# Patient Record
Sex: Female | Born: 1980 | Race: White | Hispanic: Refuse to answer | Marital: Married | Smoking: Never smoker
Health system: Southern US, Community
[De-identification: ages and names within clinical notes are randomized; demographics above are authoritative.]

## PROBLEM LIST (undated history)

## (undated) DIAGNOSIS — J309 Allergic rhinitis, unspecified: Secondary | ICD-10-CM

## (undated) DIAGNOSIS — K219 Gastro-esophageal reflux disease without esophagitis: Secondary | ICD-10-CM

## (undated) DIAGNOSIS — N200 Calculus of kidney: Secondary | ICD-10-CM

## (undated) DIAGNOSIS — D693 Immune thrombocytopenic purpura: Secondary | ICD-10-CM

## (undated) DIAGNOSIS — M5432 Sciatica, left side: Secondary | ICD-10-CM

## (undated) DIAGNOSIS — IMO0002 Reserved for concepts with insufficient information to code with codable children: Secondary | ICD-10-CM

## (undated) DIAGNOSIS — Z800 Family history of malignant neoplasm of digestive organs: Secondary | ICD-10-CM

## (undated) DIAGNOSIS — O99113 Other diseases of the blood and blood-forming organs and certain disorders involving the immune mechanism complicating pregnancy, third trimester: Principal | ICD-10-CM

## (undated) DIAGNOSIS — R51 Headache: Secondary | ICD-10-CM

## (undated) DIAGNOSIS — F419 Anxiety disorder, unspecified: Secondary | ICD-10-CM

## (undated) DIAGNOSIS — Z8042 Family history of malignant neoplasm of prostate: Secondary | ICD-10-CM

## (undated) HISTORY — PX: WISDOM TOOTH EXTRACTION: SHX21

## (undated) HISTORY — DX: Sciatica, left side: M54.32

## (undated) HISTORY — DX: Family history of malignant neoplasm of digestive organs: Z80.0

## (undated) HISTORY — DX: Gastro-esophageal reflux disease without esophagitis: K21.9

## (undated) HISTORY — DX: Immune thrombocytopenic purpura: D69.3

## (undated) HISTORY — DX: Family history of malignant neoplasm of prostate: Z80.42

## (undated) HISTORY — PX: OTHER SURGICAL HISTORY: SHX169

## (undated) HISTORY — DX: Calculus of kidney: N20.0

## (undated) HISTORY — DX: Allergic rhinitis, unspecified: J30.9

## (undated) HISTORY — DX: Reserved for concepts with insufficient information to code with codable children: IMO0002

## (undated) HISTORY — DX: Other diseases of the blood and blood-forming organs and certain disorders involving the immune mechanism complicating pregnancy, third trimester: O99.113

---
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
## 2010-07-25 ENCOUNTER — Ambulatory Visit (INDEPENDENT_AMBULATORY_CARE_PROVIDER_SITE_OTHER): Payer: BC Managed Care – PPO | Admitting: Family Medicine

## 2010-07-25 ENCOUNTER — Encounter: Payer: Self-pay | Admitting: Family Medicine

## 2010-07-25 DIAGNOSIS — Z0000 Encounter for general adult medical examination without abnormal findings: Secondary | ICD-10-CM | POA: Insufficient documentation

## 2010-07-25 DIAGNOSIS — M799 Soft tissue disorder, unspecified: Secondary | ICD-10-CM

## 2010-07-25 DIAGNOSIS — M7989 Other specified soft tissue disorders: Secondary | ICD-10-CM

## 2010-07-25 LAB — HEPATIC FUNCTION PANEL
ALT: 16 U/L (ref 0–35)
AST: 20 U/L (ref 0–37)
Albumin: 4 g/dL (ref 3.5–5.2)
Alkaline Phosphatase: 48 U/L (ref 39–117)

## 2010-07-25 LAB — CBC WITH DIFFERENTIAL/PLATELET
Basophils Relative: 0.6 % (ref 0.0–3.0)
Eosinophils Relative: 7 % — ABNORMAL HIGH (ref 0.0–5.0)
Lymphocytes Relative: 26.6 % (ref 12.0–46.0)
MCV: 90.1 fl (ref 78.0–100.0)
Monocytes Relative: 7.3 % (ref 3.0–12.0)
Neutrophils Relative %: 58.5 % (ref 43.0–77.0)
RBC: 3.98 Mil/uL (ref 3.87–5.11)
WBC: 4.9 10*3/uL (ref 4.5–10.5)

## 2010-07-25 LAB — BASIC METABOLIC PANEL
BUN: 14 mg/dL (ref 6–23)
Chloride: 108 mEq/L (ref 96–112)
Glucose, Bld: 79 mg/dL (ref 70–99)
Potassium: 4 mEq/L (ref 3.5–5.1)

## 2010-07-25 LAB — TSH: TSH: 2.42 u[IU]/mL (ref 0.35–5.50)

## 2010-07-25 LAB — LIPID PANEL
Total CHOL/HDL Ratio: 5
VLDL: 25.4 mg/dL (ref 0.0–40.0)

## 2010-07-25 NOTE — Progress Notes (Signed)
  Subjective:    Patient ID: Rachael Chandler, female    DOB: April 26, 1980, 30 y.o.   MRN: [id number]  HPI Here today to establish care.  Previous MD- Isabell Jarvis [facility]).  GYN- [facility]  Swollen area on R buttock- fell in early July and area remains swollen.  No pain, bruise has resolved.   Review of Systems Patient reports no vision/hearing changes, adenopathy, fever, weight change, persistant/recurrent hoarseness, swallowing issues, chest pain, palpitations, edema, persistant/recurrent cough, hemoptysis, dyspnea (rest/ exertional/paroxysmal nocturnal), gastrointestinal bleeding (melena, rectal bleeding), abdominal pain, significant heartburn, bowel changes, GU symptoms (dysuria, hematuria, pyuria, incontinence), Gyn symptoms (abnormal  bleeding , pain), syncope, focal weakness, memory loss, numbness & tingling, skin/hair/nail changes, abnormal bruising or bleeding, anxiety,or depression.    Objective:   Physical Exam BP 112/76  Temp(Src) 97 F (36.1 C) (Oral)  Ht 5\' 3"  (1.6 m)  Wt 159 lb 6 oz (72.292 kg)  BMI 28.23 kg/m2  General Appearance:    Alert, cooperative, no distress, appears stated age  Head:    Normocephalic, without obvious abnormality, atraumatic  Eyes:    PERRL, conjunctiva/corneas clear, EOM's intact, fundi    benign, both eyes  Ears:    Normal TM's and external ear canals, both ears  Nose:   Nares normal, septum midline, mucosa normal, no drainage    or sinus tenderness  Throat:   Lips, mucosa, and tongue normal; teeth and gums normal  Neck:   Supple, symmetrical, trachea midline, no adenopathy;    thyroid:  no enlargement/tenderness/nodules  Back:     Symmetric, no curvature, ROM normal, no CVA tenderness  Lungs:     Clear to auscultation bilaterally, respirations unlabored  Chest Wall:    No tenderness or deformity   Heart:    Regular rate and rhythm, S1 and S2 normal, no murmur, rub   or gallop  Breast Exam:    GYN  Abdomen:     Soft, non-tender, bowel  sounds active all four quadrants,    no masses, no organomegaly  Genitalia:    GYN  Rectal:    GYN  Extremities:   Extremities normal, atraumatic, no cyanosis or edema.  R buttock w/ firm, soft tissue mass ~5 cm diameter- nontender.  Pulses:   2+ and symmetric all extremities  Skin:   Skin color, texture, turgor normal, no rashes or lesions  Lymph nodes:   Cervical, supraclavicular, and axillary nodes normal  Neurologic:   CNII-XII intact, normal strength, sensation and reflexes    throughout          Assessment & Plan:

## 2010-07-25 NOTE — Patient Instructions (Signed)
We will notify you of your lab results Someone will call you with your ultrasound appt Call with any questions or concerns Your exam looks great!  Keep up the good work! Welcome!  We're glad to have you!

## 2010-07-27 ENCOUNTER — Other Ambulatory Visit: Payer: Self-pay | Admitting: Family Medicine

## 2010-07-27 ENCOUNTER — Ambulatory Visit
Admission: RE | Admit: 2010-07-27 | Discharge: 2010-07-27 | Disposition: A | Discharge status: Home / Self Care | Payer: BC Managed Care – PPO | Source: Ambulatory Visit | Attending: Family Medicine | Admitting: Family Medicine

## 2010-07-27 DIAGNOSIS — M7989 Other specified soft tissue disorders: Secondary | ICD-10-CM

## 2010-07-28 LAB — VITAMIN D 1,25 DIHYDROXY
Vitamin D 1, 25 (OH)2 Total: 43 pg/mL (ref 18–72)
Vitamin D3 1, 25 (OH)2: 43 pg/mL

## 2010-07-30 ENCOUNTER — Encounter: Payer: Self-pay | Admitting: *Deleted

## 2010-07-30 ENCOUNTER — Telehealth: Payer: Self-pay | Admitting: *Deleted

## 2010-07-30 NOTE — Telephone Encounter (Addendum)
Left message to call office  Message copied by Candie Echevaria on Mon Jul 30, 2010  8:30 AM ------      Message from: Neena Rhymes      Created: Sun Jul 29, 2010  8:45 PM       No mass seen on [place]- radiologist suspects what we discussed during OV, post-traumatic fat necrosis.  This is where injury causes fat cells to rupture and then reshape.  At this time there is no need for further w/u but will continue to follow.

## 2010-07-30 NOTE — Telephone Encounter (Signed)
Pt aware.

## 2010-08-07 NOTE — Assessment & Plan Note (Signed)
Area on buttock is most likely fat necrosis- discussed this w/ pt.  Will get [place] to r/o other mass lesions.  Pt expressed understanding and is in agreement w/ plan.

## 2010-08-07 NOTE — Assessment & Plan Note (Signed)
Pt's PE WNL w/ exception of soft tissue mass (see below).  UTD on health maintenance, anticipatory guidance provided.

## 2011-01-22 ENCOUNTER — Telehealth: Payer: Self-pay

## 2011-01-22 NOTE — Telephone Encounter (Signed)
Agree w/ advice given.  If concerns re: clot needs ER or UC evaluation.  If having back and/or leg pain w/out swelling of leg, redness, pain to touch lower leg she can be seen in the office by any of [place].

## 2011-01-22 NOTE — Telephone Encounter (Signed)
Pt left message seeking advise on pain in back and leg. Pt questions blood clot in leg.  Returned call to pt. Left a message on personally identified voicemail advising pt that if she thinks it is a blood clot, she should be seen at an Urgent Care or ED due to the severity of clot situations. Message also advised pt to be seen in office if she doesn't think it is a clot but it can't wait until her scheduled appointment on Oct. 11, 2012. Pt advised that Dr. Beverely Low is out of the office this afternoon but that she can be seen by another physician.

## 2011-01-31 ENCOUNTER — Encounter: Payer: Self-pay | Admitting: Family Medicine

## 2011-01-31 ENCOUNTER — Ambulatory Visit (INDEPENDENT_AMBULATORY_CARE_PROVIDER_SITE_OTHER): Payer: BC Managed Care – PPO | Admitting: Family Medicine

## 2011-01-31 VITALS — BP 110/64 | Temp 98.6°F | Wt 156.0 lb

## 2011-01-31 DIAGNOSIS — M549 Dorsalgia, unspecified: Secondary | ICD-10-CM | POA: Insufficient documentation

## 2011-01-31 NOTE — Progress Notes (Signed)
  Subjective:    Patient ID: Rachael Chandler, female    DOB: 07-19-1980, 30 y.o.   MRN: [id number]  HPI Back pain- pt recently moved and was helping husband unpack the garage.  Bent forward and lifted heavy box w/out bending legs.  Pain radiated down L leg, had some numbness of toes.  Pain is improving.  This occurred 2 weeks ago and last felt numbness ~1 week ago.  No bowel or bladder incontinence.   Review of Systems For ROS see HPI     Objective:   Physical Exam  Vitals reviewed. Constitutional: She appears well-developed and well-nourished. No distress.  Musculoskeletal: Normal range of motion. She exhibits no edema and no tenderness.       (-) SLR bilaterally Full extension and flexion of back No TTP over spine or paraspinal muscles          Assessment & Plan:

## 2011-01-31 NOTE — Patient Instructions (Signed)
We'll call you with your physical therapy appt Take Ibuprofen as needed for pain or muscle spasm Heating pad for pain relief Lift w/ your legs!!! Call with any questions or concerns Hang in there!!!

## 2011-01-31 NOTE — Assessment & Plan Note (Signed)
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  She has very large breasts and has baseline level of back strain.  Pt would benefit from core strengthening to protect her spine and a home exercise program.  Will refer to PT.  Ibuprofen and heat prn.  Reviewed supportive care and red flags that should prompt return.  Pt expressed understanding and is in agreement w/ plan.

## 2011-03-25 ENCOUNTER — Telehealth: Payer: Self-pay | Admitting: *Deleted

## 2011-03-25 DIAGNOSIS — M549 Dorsalgia, unspecified: Secondary | ICD-10-CM

## 2011-03-25 NOTE — Telephone Encounter (Signed)
Discuss with patient, awaiting appt info. 

## 2011-03-25 NOTE — Telephone Encounter (Signed)
Pt called to report that she is still having issue with her back. Pt notes that when the back pain occurs she has several BM for that day. Pt indicated at OV Dr Beverely Low had asked about this issue but at the time she had not noticed any changes until recently. Pt has not been to physical therapy as of yet because she has not made a appt. .Please advise

## 2011-03-25 NOTE — Telephone Encounter (Signed)
She based on the fact that she has had back pain for almost 2 months- she needs ortho referral.  Should hold on PT until she is evaluated.

## 2011-04-02 ENCOUNTER — Encounter: Payer: Self-pay | Admitting: Family Medicine

## 2011-04-17 ENCOUNTER — Telehealth: Payer: Self-pay | Admitting: Family Medicine

## 2011-04-17 NOTE — Telephone Encounter (Signed)
IN REFERENCE TO ORTHOPAEDIC REFERRAL, SINCE 03-26-2011, PATIENT WILL NOT RETURN CALLS FROM [facility], MYSELF, OR RESPOND TO THE LETTER I MAILED.

## 2011-04-17 NOTE — Telephone Encounter (Signed)
Noted! Thank you

## 2011-05-23 ENCOUNTER — Ambulatory Visit (INDEPENDENT_AMBULATORY_CARE_PROVIDER_SITE_OTHER): Payer: BC Managed Care – PPO | Admitting: Family Medicine

## 2011-05-23 ENCOUNTER — Encounter: Payer: Self-pay | Admitting: Family Medicine

## 2011-05-23 VITALS — BP 115/75 | HR 107 | Temp 99.7°F | Ht 64.0 in | Wt 156.8 lb

## 2011-05-23 DIAGNOSIS — J111 Influenza due to unidentified influenza virus with other respiratory manifestations: Secondary | ICD-10-CM | POA: Insufficient documentation

## 2011-05-23 MED ORDER — GUAIFENESIN-CODEINE 100-10 MG/5ML PO SYRP: 10.0000 mL | ORAL_SOLUTION | Freq: Three times a day (TID) | ORAL | Status: AC | PRN

## 2011-05-23 MED ORDER — OSELTAMIVIR PHOSPHATE 75 MG PO CAPS: 75.0000 mg | ORAL_CAPSULE | Freq: Two times a day (BID) | ORAL | Status: AC

## 2011-05-23 NOTE — Patient Instructions (Signed)
This appears to be the Flu Start the Tamiflu today at lunch and take the 2nd dose before bed Use the cough syrup as needed Alternate tylenol and ibuprofen every 4 hrs for pain and fever relief REST! Call with any questions or concerns Hang in there!!!

## 2011-05-23 NOTE — Assessment & Plan Note (Signed)
Pt's sxs and PE consistent w/ flu.  Start Tamiflu.  Cough meds prn.  Reviewed supportive care and red flags that should prompt return.  Pt expressed understanding and is in agreement w/ plan.

## 2011-05-23 NOTE — Progress Notes (Signed)
  Subjective:    Patient ID: Rachael Chandler, female    DOB: December 12, 1980, 31 y.o.   MRN: [id number]  HPI Flu- + body aches, started yesterday morning.  sxs were sudden.  + low grade temps.  + cough- wet but not productive.  + HA.  + sinus pressure.  Bilateral ear fullness.  + sick contacts.  + flu exposure   Review of Systems For ROS see HPI     Objective:   Physical Exam  Vitals reviewed. Constitutional: She appears well-developed and well-nourished. No distress.       Obviously not feeling well  HENT:  Head: Normocephalic and atraumatic.  Right Ear: Tympanic membrane normal.  Left Ear: Tympanic membrane normal.  Nose: Mucosal edema and rhinorrhea present. Right sinus exhibits no maxillary sinus tenderness and no frontal sinus tenderness. Left sinus exhibits no maxillary sinus tenderness and no frontal sinus tenderness.  Mouth/Throat: Uvula is midline and mucous membranes are normal. Posterior oropharyngeal erythema present. No oropharyngeal exudate.  Eyes: Conjunctivae and EOM are normal. Pupils are equal, round, and reactive to light.  Neck: Normal range of motion. Neck supple.  Cardiovascular: Normal rate, regular rhythm and normal heart sounds.   Pulmonary/Chest: Effort normal and breath sounds normal. No respiratory distress. She has no wheezes.       + wet cough  Lymphadenopathy:    She has no cervical adenopathy.  Skin: Skin is warm.          Assessment & Plan:

## 2011-05-24 ENCOUNTER — Telehealth: Payer: Self-pay | Admitting: *Deleted

## 2011-05-24 NOTE — Telephone Encounter (Signed)
Yes, she can take OTC meds for symptom relief

## 2011-05-24 NOTE — Telephone Encounter (Signed)
Pt seen on 05-23-11 Dx with Flu. Pt would like to know if she can take OTC meds like day or night quil for her symptoms. Please advise

## 2011-05-24 NOTE — Telephone Encounter (Signed)
Discuss with patient  

## 2011-05-31 ENCOUNTER — Ambulatory Visit (INDEPENDENT_AMBULATORY_CARE_PROVIDER_SITE_OTHER): Payer: BC Managed Care – PPO | Admitting: Family Medicine

## 2011-05-31 ENCOUNTER — Encounter: Payer: Self-pay | Admitting: Family Medicine

## 2011-05-31 VITALS — BP 116/75 | HR 73 | Temp 98.4°F | Ht 63.75 in | Wt 152.6 lb

## 2011-05-31 DIAGNOSIS — J329 Chronic sinusitis, unspecified: Secondary | ICD-10-CM

## 2011-05-31 MED ORDER — AZITHROMYCIN 250 MG PO TABS: 250.0000 mg | ORAL_TABLET | Freq: Every day | ORAL | Status: AC

## 2011-05-31 NOTE — Assessment & Plan Note (Signed)
Pt's sxs and PE consistent w/ infxn.  Start Zpack at pt's request.  Reviewed supportive care and red flags that should prompt return.  Pt expressed understanding and is in agreement w/ plan.  

## 2011-05-31 NOTE — Patient Instructions (Signed)
This appears to be a sinus infection Start the Azithromycin as directed Restart Claritin or Zyrtec daily Add Sudafed for 3-5 days to dry up congestion Drink plenty of fluids Continue the mucinex Delsym or robitussin for cough REST! Hang in there!

## 2011-05-31 NOTE — Progress Notes (Signed)
  Subjective:    Patient ID: Rachael Chandler, female    DOB: 1980/08/01, 31 y.o.   MRN: [id number]  HPI ? Sinus infxn- reports 'tons of congestion' in head an chest.  Using mucinex w/out relief.  Denies facial pain.  Bilateral ear fullness.  + PND.  Cough- productive.  No fevers.  sxs started simultaneously w/ flu end of Jan but flu improved, this did not.   Review of Systems For ROS see HPI     Objective:   Physical Exam  Vitals reviewed. Constitutional: She appears well-developed and well-nourished. No distress.  HENT:  Head: Normocephalic and atraumatic.  Right Ear: Tympanic membrane normal.  Left Ear: Tympanic membrane normal.  Nose: Mucosal edema and rhinorrhea present. Right sinus exhibits maxillary sinus tenderness and frontal sinus tenderness. Left sinus exhibits maxillary sinus tenderness and frontal sinus tenderness.  Mouth/Throat: Uvula is midline and mucous membranes are normal. Posterior oropharyngeal erythema present. No oropharyngeal exudate.  Eyes: Conjunctivae and EOM are normal. Pupils are equal, round, and reactive to light.  Neck: Normal range of motion. Neck supple.  Cardiovascular: Normal rate, regular rhythm and normal heart sounds.   Pulmonary/Chest: Effort normal and breath sounds normal. No respiratory distress. She has no wheezes.       + hacking cough  Lymphadenopathy:    She has no cervical adenopathy.          Assessment & Plan:

## 2011-06-12 ENCOUNTER — Telehealth: Payer: Self-pay | Admitting: *Deleted

## 2011-06-12 NOTE — Telephone Encounter (Signed)
Left vm advising that pt needs to allow herself more time for sxs to subside per just recently on abt, however if the sxs do not subside by next week to please call for an OV.

## 2011-06-12 NOTE — Telephone Encounter (Signed)
Since her sinus infxn was just recently and she was on abx, I would give herself more time for her sxs to resolve.  If by next week her sxs continue, she will need OV.

## 2011-06-12 NOTE — Telephone Encounter (Signed)
Pt left vm stating that she is now having problems smelling and tasting things after her recent illness, noted on 05-23-11 pt treated for flu, on 05-31-11 pt treated for sinusitis, pt was not sure if she needed to come here for OV or be seen by another MD concerning this particular matter, please advise

## 2011-09-18 ENCOUNTER — Ambulatory Visit (INDEPENDENT_AMBULATORY_CARE_PROVIDER_SITE_OTHER): Payer: BC Managed Care – PPO | Admitting: Internal Medicine

## 2011-09-18 ENCOUNTER — Telehealth: Payer: Self-pay | Admitting: Family Medicine

## 2011-09-18 ENCOUNTER — Encounter: Payer: Self-pay | Admitting: Internal Medicine

## 2011-09-18 DIAGNOSIS — R195 Other fecal abnormalities: Secondary | ICD-10-CM

## 2011-09-18 DIAGNOSIS — R109 Unspecified abdominal pain: Secondary | ICD-10-CM

## 2011-09-18 LAB — POCT URINALYSIS DIPSTICK
Glucose, UA: NEGATIVE
Ketones, UA: NEGATIVE
Leukocytes, UA: NEGATIVE
Protein, UA: NEGATIVE
Spec Grav, UA: <=1.005
Urobilinogen, UA: 0.2

## 2011-09-18 LAB — POCT URINE PREGNANCY: Preg Test, Ur: NEGATIVE

## 2011-09-18 MED ORDER — HYOSCYAMINE SULFATE 0.125 MG SL SUBL: 0.1250 mg | SUBLINGUAL_TABLET | SUBLINGUAL | Status: DC | PRN

## 2011-09-18 NOTE — Progress Notes (Signed)
  Subjective:    Patient ID: Rachael Chandler, female    DOB: [birth year], 31 y.o.   MRN: [id number]  HPI ABDOMINAL PAIN: Location: suprapubic , R > L  Onset: after BM today Around 2 pm Radiation: diffusely  Severity: up to 8 ; now "1,000,000 better" Quality: sharp  Duration: 25 min Better with: no relievers  Worse with: not beside BM  Past medical history/family history/social history were all reviewed and updated. Pertinent data: ? PMH ulcer;Mother stomach cancer ulcer           Review of Systems Nausea/Vomiting: only nausea with pain Diarrhea: no , but stools loose X 1 month Constipation: no Melena/BRBPR: BM  Today stool 1/2 black , but not tarry  Hematemesis:no  Anorexia: no  Fever/Chills: no  Dysuria/ pyuria/hematuria: no Rash:no  EtOH use: increased recently NSAIDs/ASA: no  LMP: spotting last 2 weeks Vaginal bleeding: no     Objective:   Physical Exam General appearance is one of good health and nourishment w/o distress.  Eyes: No conjunctival inflammation or scleral icterus is present.  Oral exam: Dental hygiene is good; lips and gums are healthy appearing.There is no oropharyngeal erythema or exudate noted.   Heart:  Normal rate and regular rhythm. S1 and S2 normal without gallop, murmur, click, rub or other extra sounds     Lungs:Chest clear to auscultation; no wheezes, rhonchi,rales ,or rubs present.No increased work of breathing.   Abdomen: bowel sounds normal, soft  But slightly tender  RUQ , RLQ & suprapubic area without masses, organomegaly or hernias noted.  No guarding or rebound   Skin:Warm & dry.  Intact without suspicious lesions or rashes ; no jaundice or tenting  Lymphatic: No lymphadenopathy is noted about the head, neck, axilla areas.              Assessment & Plan:   #1 acute abdominal pain post BM; ? dark stool today, loose X 1 month #2PMH "ulcers" & renal calculi #3 FH GI malignancy Plan: See orders and recommendations

## 2011-09-18 NOTE — Patient Instructions (Signed)
Stay on clear liquids for 48-72 hours or until bowels are normal.This would include  jello, sherbert (NOT ice cream), Lipton's chicken noodle soup(NOT cream based soups),Gatorade Lite, flat Ginger ale (without High Fructose Corn Syrup),dry toast or crackers, baked potato.No milk , dairy or grease until bowels are formed. Align , a [employer] , daily if stools are loose. Immodium AD for frankly watery stool. Report increasing pain, fever or rectal bleeding . Please complete stool cards

## 2011-09-18 NOTE — Telephone Encounter (Signed)
LMP ~ 4 wks ago.  Had spotting a week ago, and again two days ago.  Caller: Rachael Chandler/Patient; PCP: Sheliah Hatch.; CB#: [phone]; ; ; Call regarding Abdominal Pain/ After Bowel Movement; onset 09/18/11 1430 after passing a BM.  Has never had this type of pain.  Denies constipation, but states has had irregular stool patterns lifelong.  Denies bleeding, urinary symtoms, or other emergent sypmtoms per protocol; advised appt within 24 hours.  Appt sched 09/18/11 1615 with Dr. Alwyn Ren.

## 2011-09-18 NOTE — Telephone Encounter (Signed)
Noted  

## 2011-11-11 ENCOUNTER — Encounter: Payer: BC Managed Care – PPO | Admitting: Family Medicine

## 2012-01-08 ENCOUNTER — Ambulatory Visit (INDEPENDENT_AMBULATORY_CARE_PROVIDER_SITE_OTHER): Payer: BC Managed Care – PPO | Admitting: Family Medicine

## 2012-01-08 ENCOUNTER — Encounter: Payer: Self-pay | Admitting: Family Medicine

## 2012-01-08 VITALS — BP 123/77 | HR 90 | Temp 98.9°F | Ht 63.75 in | Wt 155.6 lb

## 2012-01-08 DIAGNOSIS — Z0000 Encounter for general adult medical examination without abnormal findings: Secondary | ICD-10-CM | POA: Insufficient documentation

## 2012-01-08 LAB — CBC WITH DIFFERENTIAL/PLATELET
Basophils Relative: 0.5 % (ref 0.0–3.0)
Eosinophils Absolute: 0.3 10*3/uL (ref 0.0–0.7)
Hemoglobin: 11.2 g/dL — ABNORMAL LOW (ref 12.0–15.0)
Lymphs Abs: 1.2 10*3/uL (ref 0.7–4.0)
MCHC: 31.8 g/dL (ref 30.0–36.0)
MCV: 83.4 fl (ref 78.0–100.0)
Monocytes Absolute: 0.4 10*3/uL (ref 0.1–1.0)
Neutro Abs: 2.5 10*3/uL (ref 1.4–7.7)
Neutrophils Relative %: 56.6 % (ref 43.0–77.0)
WBC: 4.5 10*3/uL (ref 4.5–10.5)

## 2012-01-08 LAB — BASIC METABOLIC PANEL
BUN: 13 mg/dL (ref 6–23)
Calcium: 9.2 mg/dL (ref 8.4–10.5)
Creatinine, Ser: 0.8 mg/dL (ref 0.4–1.2)
GFR: 94.35 mL/min (ref >60.00)

## 2012-01-08 LAB — LIPID PANEL
Cholesterol: 174 mg/dL (ref 0–200)
HDL: 38.3 mg/dL — ABNORMAL LOW (ref >39.00)
VLDL: 21 mg/dL (ref 0.0–40.0)

## 2012-01-08 LAB — HEPATIC FUNCTION PANEL: Total Bilirubin: 1.3 mg/dL — ABNORMAL HIGH (ref 0.3–1.2)

## 2012-01-08 LAB — TSH: TSH: 1.57 u[IU]/mL (ref 0.35–5.50)

## 2012-01-08 NOTE — Progress Notes (Signed)
  Subjective:    Patient ID: Rachael Chandler, female    DOB: 02-15-1981, 31 y.o.   MRN: [id number]  HPI CPE- has GYN appt next week Su Hilt).  Has had 2 episodes of vertigo recently.  Currently asymptomatic.  Intermittent low back pain radiating to legs.  Hx of similar.  No current sxs.  Has never been to PT- would like to hold on this while she loses weight.   Review of Systems Patient reports no vision/ hearing changes, adenopathy,fever, weight change,  persistant/recurrent hoarseness , swallowing issues, chest pain, palpitations, edema, persistant/recurrent cough, hemoptysis, dyspnea (rest/exertional/paroxysmal nocturnal), gastrointestinal bleeding (melena, rectal bleeding), abdominal pain, significant heartburn, bowel changes, GU symptoms (dysuria, hematuria, incontinence), Gyn symptoms (abnormal  bleeding, pain),  syncope, focal weakness, memory loss, numbness & tingling, skin/hair/nail changes, abnormal bruising or bleeding, anxiety, or depression.     Objective:   Physical Exam General Appearance:    Alert, cooperative, no distress, appears stated age  Head:    Normocephalic, without obvious abnormality, atraumatic  Eyes:    PERRL, conjunctiva/corneas clear, EOM's intact, fundi    benign, both eyes  Ears:    Normal TM's and external ear canals, both ears  Nose:   Nares normal, septum midline, mucosa normal, no drainage    or sinus tenderness  Throat:   Lips, mucosa, and tongue normal; teeth and gums normal  Neck:   Supple, symmetrical, trachea midline, no adenopathy;    Thyroid: no enlargement/tenderness/nodules  Back:     Symmetric, no curvature, ROM normal, no CVA tenderness  Lungs:     Clear to auscultation bilaterally, respirations unlabored  Chest Wall:    No tenderness or deformity   Heart:    Regular rate and rhythm, S1 and S2 normal, no murmur, rub   or gallop  Breast Exam:    Deferred to GYN  Abdomen:     Soft, non-tender, bowel sounds active all four quadrants,    no  masses, no organomegaly  Genitalia:    Deferred to GYN  Rectal:    Extremities:   Extremities normal, atraumatic, no cyanosis or edema  Pulses:   2+ and symmetric all extremities  Skin:   Skin color, texture, turgor normal, no rashes or lesions  Lymph nodes:   Cervical, supraclavicular, and axillary nodes normal  Neurologic:   CNII-XII intact, normal strength, sensation and reflexes    throughout          Assessment & Plan:

## 2012-01-08 NOTE — Assessment & Plan Note (Signed)
Pt's PE WNL.  Has GYN appt pending.  Check labs.  Anticipatory guidance provided. 

## 2012-01-08 NOTE — Patient Instructions (Addendum)
Follow up in 1 year or as needed We'll notify you of your lab results and make any changes if needed Keep up the good work! Call with any questions or concerns If you change your mind about the PT for your back, let me know Happy Early Birthday!!!

## 2012-01-09 ENCOUNTER — Encounter: Payer: Self-pay | Admitting: *Deleted

## 2012-01-12 LAB — VITAMIN D 1,25 DIHYDROXY: Vitamin D3 1, 25 (OH)2: 76 pg/mL

## 2012-01-13 ENCOUNTER — Encounter: Payer: Self-pay | Admitting: Obstetrics and Gynecology

## 2012-01-13 ENCOUNTER — Ambulatory Visit (INDEPENDENT_AMBULATORY_CARE_PROVIDER_SITE_OTHER): Payer: BC Managed Care – PPO | Admitting: Obstetrics and Gynecology

## 2012-01-13 VITALS — BP 110/60 | HR 70 | Resp 16 | Ht 63.0 in | Wt 155.0 lb

## 2012-01-13 DIAGNOSIS — N920 Excessive and frequent menstruation with regular cycle: Secondary | ICD-10-CM

## 2012-01-13 DIAGNOSIS — Z139 Encounter for screening, unspecified: Secondary | ICD-10-CM

## 2012-01-13 DIAGNOSIS — Z124 Encounter for screening for malignant neoplasm of cervix: Secondary | ICD-10-CM

## 2012-01-13 NOTE — Addendum Note (Signed)
Addended by: Marla Roe A on: 01/13/2012 04:44 PM   Modules accepted: Orders
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
## 2012-01-14 LAB — TESTOSTERONE, FREE, TOTAL, SHBG: Testosterone-% Free: 1.7 % (ref 0.4–2.4)

## 2012-01-14 LAB — PROLACTIN: Prolactin: 13.8 ng/mL

## 2012-01-15 LAB — PAP IG W/ RFLX HPV ASCU

## 2012-01-28 ENCOUNTER — Other Ambulatory Visit: Payer: Self-pay | Admitting: Obstetrics and Gynecology

## 2012-01-28 DIAGNOSIS — N920 Excessive and frequent menstruation with regular cycle: Secondary | ICD-10-CM

## 2012-02-03 ENCOUNTER — Ambulatory Visit (INDEPENDENT_AMBULATORY_CARE_PROVIDER_SITE_OTHER): Payer: BC Managed Care – PPO

## 2012-02-03 ENCOUNTER — Other Ambulatory Visit: Payer: Self-pay | Admitting: Obstetrics and Gynecology

## 2012-02-03 ENCOUNTER — Encounter: Payer: Self-pay | Admitting: Obstetrics and Gynecology

## 2012-02-03 ENCOUNTER — Ambulatory Visit (INDEPENDENT_AMBULATORY_CARE_PROVIDER_SITE_OTHER): Payer: BC Managed Care – PPO | Admitting: Obstetrics and Gynecology

## 2012-02-03 VITALS — BP 108/70 | Resp 14 | Ht 63.0 in | Wt 155.0 lb

## 2012-02-03 DIAGNOSIS — N920 Excessive and frequent menstruation with regular cycle: Secondary | ICD-10-CM

## 2012-02-03 NOTE — Progress Notes (Addendum)
Here for sonohyst secondary to heavy cycles but also skips cycles (does have desire to conceive)  Sonohyst performed without difficulty per protocol - Uterus 8.2 x 4.5 x 4.1 cm ovaries with appropriate draping of the parents resembling polycystic ovaries endometrial polyp measuring 2.2 cm cervical polyp measuring 1.3 cm Labs reviewed and wnl Pap neg  A/P 2 polyps visualized - 1 about 2cm in cavity and another in cervical canal Options and recs discussed Plan hyst/D&C/polypectomy/poss resection at [facility]

## 2012-02-19 ENCOUNTER — Telehealth: Payer: Self-pay | Admitting: Obstetrics and Gynecology

## 2012-02-20 ENCOUNTER — Encounter (HOSPITAL_COMMUNITY): Payer: Self-pay | Admitting: Pharmacist

## 2012-02-21 ENCOUNTER — Ambulatory Visit (INDEPENDENT_AMBULATORY_CARE_PROVIDER_SITE_OTHER): Payer: BC Managed Care – PPO | Admitting: Obstetrics and Gynecology

## 2012-02-21 ENCOUNTER — Encounter: Payer: Self-pay | Admitting: Obstetrics and Gynecology

## 2012-02-21 VITALS — BP 110/70 | HR 80 | Temp 98.5°F | Resp 14 | Ht 63.0 in | Wt 157.0 lb

## 2012-02-21 DIAGNOSIS — Z01818 Encounter for other preprocedural examination: Secondary | ICD-10-CM

## 2012-02-21 NOTE — Patient Instructions (Signed)
Begin a multiivitamin with at least 400 mcg  or more  and take daily.
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
## 2012-02-24 ENCOUNTER — Other Ambulatory Visit: Payer: Self-pay | Admitting: Obstetrics and Gynecology

## 2012-02-26 ENCOUNTER — Ambulatory Visit (HOSPITAL_COMMUNITY): Payer: BC Managed Care – PPO | Admitting: Anesthesiology

## 2012-02-26 ENCOUNTER — Ambulatory Visit (HOSPITAL_COMMUNITY)
Admission: RE | Admit: 2012-02-26 | Discharge: 2012-02-26 | Disposition: A | Discharge status: Home / Self Care | Payer: BC Managed Care – PPO | Source: Ambulatory Visit | Attending: Obstetrics and Gynecology | Admitting: Obstetrics and Gynecology

## 2012-02-26 ENCOUNTER — Encounter (HOSPITAL_COMMUNITY): Payer: Self-pay | Admitting: Anesthesiology

## 2012-02-26 ENCOUNTER — Encounter (HOSPITAL_COMMUNITY)
Admission: RE | Disposition: A | Discharge status: Home / Self Care | Payer: Self-pay | Source: Ambulatory Visit | Attending: Obstetrics and Gynecology

## 2012-02-26 ENCOUNTER — Encounter (HOSPITAL_COMMUNITY): Payer: Self-pay | Admitting: *Deleted

## 2012-02-26 DIAGNOSIS — N840 Polyp of corpus uteri: Secondary | ICD-10-CM | POA: Insufficient documentation

## 2012-02-26 DIAGNOSIS — N920 Excessive and frequent menstruation with regular cycle: Secondary | ICD-10-CM | POA: Insufficient documentation

## 2012-02-26 DIAGNOSIS — N841 Polyp of cervix uteri: Secondary | ICD-10-CM | POA: Insufficient documentation

## 2012-02-26 HISTORY — DX: Headache: R51

## 2012-02-26 HISTORY — PX: DILATATION & CURRETTAGE/HYSTEROSCOPY WITH RESECTOCOPE: SHX5572

## 2012-02-26 LAB — CBC
Platelets: 175 10*3/uL (ref 150–400)
RBC: 3.93 MIL/uL (ref 3.87–5.11)
WBC: 4.2 10*3/uL (ref 4.0–10.5)

## 2012-02-26 LAB — HCG, SERUM, QUALITATIVE: Preg, Serum: NEGATIVE

## 2012-02-26 SURGERY — DILATATION & CURETTAGE/HYSTEROSCOPY WITH RESECTOCOPE
Anesthesia: General | Site: Uterus | Wound class: Clean Contaminated

## 2012-02-26 MED ORDER — KETOROLAC TROMETHAMINE 30 MG/ML IJ SOLN
INTRAMUSCULAR | Status: AC
  Filled 2012-02-26: qty 1

## 2012-02-26 MED ORDER — FENTANYL CITRATE 0.05 MG/ML IJ SOLN
INTRAMUSCULAR | Status: AC
  Administered 2012-02-26: 50 ug via INTRAVENOUS
  Filled 2012-02-26: qty 2

## 2012-02-26 MED ORDER — MIDAZOLAM HCL 2 MG/2ML IJ SOLN: 0.5000 mg | Freq: Once | INTRAMUSCULAR | Status: DC | PRN

## 2012-02-26 MED ORDER — KETOROLAC TROMETHAMINE 30 MG/ML IJ SOLN: 15.0000 mg | Freq: Once | INTRAMUSCULAR | Status: DC | PRN

## 2012-02-26 MED ORDER — ONDANSETRON HCL 4 MG/2ML IJ SOLN
INTRAMUSCULAR | Status: DC | PRN
  Administered 2012-02-26: 4 mg via INTRAVENOUS

## 2012-02-26 MED ORDER — LACTATED RINGERS IV SOLN: INTRAVENOUS | Status: DC | PRN

## 2012-02-26 MED ORDER — GLYCINE 1.5 % IR SOLN
Status: DC | PRN
  Administered 2012-02-26: 3000 mL

## 2012-02-26 MED ORDER — LIDOCAINE HCL 1 % IJ SOLN
INTRAMUSCULAR | Status: DC | PRN
  Administered 2012-02-26: 10 mL

## 2012-02-26 MED ORDER — LIDOCAINE HCL (CARDIAC) 20 MG/ML IV SOLN
INTRAVENOUS | Status: DC | PRN
  Administered 2012-02-26: 30 mg via INTRAVENOUS

## 2012-02-26 MED ORDER — MEPERIDINE HCL 25 MG/ML IJ SOLN: 6.2500 mg | INTRAMUSCULAR | Status: DC | PRN

## 2012-02-26 MED ORDER — LIDOCAINE HCL (CARDIAC) 20 MG/ML IV SOLN
INTRAVENOUS | Status: AC
  Filled 2012-02-26: qty 5

## 2012-02-26 MED ORDER — HYDROCODONE-ACETAMINOPHEN 5-500 MG PO TABS: 1.0000 | ORAL_TABLET | Freq: Four times a day (QID) | ORAL | Status: DC | PRN

## 2012-02-26 MED ORDER — MIDAZOLAM HCL 2 MG/2ML IJ SOLN
INTRAMUSCULAR | Status: AC
  Filled 2012-02-26: qty 2

## 2012-02-26 MED ORDER — FENTANYL CITRATE 0.05 MG/ML IJ SOLN
INTRAMUSCULAR | Status: AC
  Filled 2012-02-26: qty 2

## 2012-02-26 MED ORDER — PROPOFOL 10 MG/ML IV EMUL
INTRAVENOUS | Status: AC
  Filled 2012-02-26: qty 20

## 2012-02-26 MED ORDER — MIDAZOLAM HCL 5 MG/5ML IJ SOLN
INTRAMUSCULAR | Status: DC | PRN
  Administered 2012-02-26: 2 mg via INTRAVENOUS

## 2012-02-26 MED ORDER — KETOROLAC TROMETHAMINE 30 MG/ML IJ SOLN
INTRAMUSCULAR | Status: DC | PRN
  Administered 2012-02-26: 30 mg via INTRAVENOUS

## 2012-02-26 MED ORDER — PROMETHAZINE HCL 25 MG/ML IJ SOLN: 6.2500 mg | INTRAMUSCULAR | Status: DC | PRN

## 2012-02-26 MED ORDER — FENTANYL CITRATE 0.05 MG/ML IJ SOLN
25.0000 ug | INTRAMUSCULAR | Status: DC | PRN
  Administered 2012-02-26: 50 ug via INTRAVENOUS

## 2012-02-26 MED ORDER — ONDANSETRON HCL 4 MG/2ML IJ SOLN
INTRAMUSCULAR | Status: AC
  Filled 2012-02-26: qty 2

## 2012-02-26 MED ORDER — IBUPROFEN 600 MG PO TABS: 600.0000 mg | ORAL_TABLET | Freq: Four times a day (QID) | ORAL | Status: DC | PRN

## 2012-02-26 MED ORDER — LACTATED RINGERS IV SOLN
INTRAVENOUS | Status: DC
  Administered 2012-02-26: 125 mL/h via INTRAVENOUS
  Administered 2012-02-26: 13:00:00 via INTRAVENOUS

## 2012-02-26 MED ORDER — FENTANYL CITRATE 0.05 MG/ML IJ SOLN
INTRAMUSCULAR | Status: DC | PRN
  Administered 2012-02-26 (×2): 50 ug via INTRAVENOUS

## 2012-02-26 MED ORDER — PROPOFOL INFUSION 10 MG/ML OPTIME
INTRAVENOUS | Status: DC | PRN
  Administered 2012-02-26: 80 ug/kg/min via INTRAVENOUS

## 2012-02-26 SURGICAL SUPPLY — 16 items
CANISTER SUCTION 2500CC (MISCELLANEOUS) ×2 IMPLANT
CATH ROBINSON RED A/P 16FR (CATHETERS) ×2 IMPLANT
CLOTH BEACON ORANGE TIMEOUT ST (SAFETY) ×2 IMPLANT
CONTAINER PREFILL 10% NBF 60ML (FORM) ×4 IMPLANT
DRESSING TELFA 8X3 (GAUZE/BANDAGES/DRESSINGS) ×2 IMPLANT
ELECT REM PT RETURN 9FT ADLT (ELECTROSURGICAL) ×2
ELECTRODE REM PT RTRN 9FT ADLT (ELECTROSURGICAL) ×1 IMPLANT
GLOVE BIO SURGEON STRL SZ7.5 (GLOVE) ×4 IMPLANT
GLOVE BIOGEL PI IND STRL 7.5 (GLOVE) ×1 IMPLANT
GLOVE BIOGEL PI INDICATOR 7.5 (GLOVE) ×1
GOWN STRL REIN XL XLG (GOWN DISPOSABLE) ×4 IMPLANT
LOOP ANGLED CUTTING 22FR (CUTTING LOOP) IMPLANT
PACK HYSTEROSCOPY LF (CUSTOM PROCEDURE TRAY) ×2 IMPLANT
PAD OB MATERNITY 4.3X12.25 (PERSONAL CARE ITEMS) ×2 IMPLANT
TOWEL OR 17X24 6PK STRL BLUE (TOWEL DISPOSABLE) ×4 IMPLANT
WATER STERILE IRR 1000ML POUR (IV SOLUTION) ×2 IMPLANT

## 2012-02-26 NOTE — Op Note (Signed)
Preop Diagnosis: Menorrhagia, Polyps   Postop Diagnosis: mennorrghia ,polyps   Procedure: DILATATION & CURETTAGE/HYSTEROSCOPY  Anesthesia: Local   Anesthesiologist: Velna Hatchet, MD   Attending: Purcell Nails, MD   Assistant: N/a  Findings: Approx 1cm endometrial and 1cm endocervical polyp  Pathology: 1.Endometrial Polyp 2.Endocervical Polyp 3.Endometrial Curettings  Fluids: 900 cc  Hysteroscopic Fluid Deficit: 50 cc  UOP: 30 cc via straight cath prior to procedure  EBL: Minimal  Complications: None  Procedure:The patient was taken to the operating room after the risks, benefits and alternatives were discussed with the patient. The patient verbalized understanding and consent signed and witnessed. The patient was placed under general anesthesia with an LMA per anesthesiologist and prepped and draped in the normal sterile fashion.  Time Out was performed per protocol.  A bivalve speculum was placed in the patient's vagina and the anterior lip of the cervix was grasped with a single tooth tenaculum. A paracervical block was administered using a total of 10 cc of 1% lidocaine. The uterus sounded to 7 cm. The cervix was dilated for passage of the hysteroscope.  The hysteroscope was introduced into the uterine cavity and findings as noted above. Sharp curettage was performed until a gritty texture was noted and curettings were sent to pathology. The hysteroscope was reintroduced and no obvious remaining intracavitary lesions were noted.  All instruments were removed. Sponge lap and needle count was correct. The patient tolerated the procedure well and was returned to the recovery room in good condition.

## 2012-02-26 NOTE — Transfer of Care (Signed)
Immediate Anesthesia Transfer of Care Note  Patient: Rachael Chandler  Procedure(s) Performed: Procedure(s) (LRB) with comments: DILATATION & CURETTAGE/HYSTEROSCOPY WITH RESECTOCOPE (N/A) -    Patient Location: PACU  Anesthesia Type:General  Level of Consciousness: awake, oriented and patient cooperative  Airway & Oxygen Therapy: Patient Spontanous Breathing  Post-op Assessment: Report given to PACU RN and Post -op Vital signs reviewed and stable  Post vital signs: Reviewed and stable  Complications: No apparent anesthesia complications

## 2012-02-26 NOTE — Anesthesia Preprocedure Evaluation (Addendum)
Anesthesia Evaluation  Patient identified by MRN, date of birth, ID band Patient awake    Reviewed: Allergy & Precautions, H&P , Patient's Chart, lab work & pertinent test results, reviewed documented beta blocker date and time   History of Anesthesia Complications Negative for: history of anesthetic complications  Airway Mallampati: II TM Distance: >3 FB Neck ROM: full    Dental No notable dental hx.    Pulmonary neg pulmonary ROS,  breath sounds clear to auscultation  Pulmonary exam normal       Cardiovascular Exercise Tolerance: Good negative cardio ROS  Rhythm:regular Rate:Normal     Neuro/Psych  Headaches,  Neuromuscular disease negative neurological ROS  negative psych ROS   GI/Hepatic negative GI ROS, Neg liver ROS, GERD-  Controlled,  Endo/Other  negative endocrine ROS  Renal/GU Renal diseasenegative Renal ROS     Musculoskeletal   Abdominal   Peds  Hematology negative hematology ROS (+)   Anesthesia Other Findings Ulcer   clinical dx; no Endoscopy Kidney stones    x 1  h/o    Sciatica of left side     GERD (gastroesophageal reflux disease)        Headache   otc meds prn    Reproductive/Obstetrics negative OB ROS                          Anesthesia Physical Anesthesia Plan  ASA: II  Anesthesia Plan: MAC   Post-op Pain Management:    Induction:   Airway Management Planned:   Additional Equipment:   Intra-op Plan:   Post-operative Plan:   Informed Consent: I have reviewed the patients History and Physical, chart, labs and discussed the procedure including the risks, benefits and alternatives for the proposed anesthesia with the patient or authorized representative who has indicated his/her understanding and acceptance.   Dental Advisory Given  Plan Discussed with: CRNA, Surgeon and Anesthesiologist  Anesthesia Plan Comments:       with paracervical  block Anesthesia Quick Evaluation

## 2012-02-26 NOTE — Interval H&P Note (Signed)
History and Physical Interval Note:  02/26/2012 11:10 AM  Rachael Chandler  has presented today for surgery, with the diagnosis of Menorrhagia, Polyps  The various methods of treatment have been discussed with the patient and family. After consideration of risks, benefits and other options for treatment, the patient has consented to  Procedure(s) (LRB) with comments: DILATATION & CURETTAGE/HYSTEROSCOPY WITH RESECTOCOPE (N/A) - 90 minutes  as a surgical intervention .  The patient's history has been reviewed, patient examined, no change in status, stable for surgery.  I have reviewed the patient's chart and labs.  Questions were answered to the patient's satisfaction.     Purcell Nails
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
## 2012-02-26 NOTE — Anesthesia Postprocedure Evaluation (Signed)
Anesthesia Post Note  Patient: Rachael Chandler  Procedure(s) Performed: Procedure(s) (LRB): DILATATION & CURETTAGE/HYSTEROSCOPY WITH RESECTOCOPE (N/A)  Anesthesia type: MAC  Patient location: PACU  Post pain: Pain level controlled  Post assessment: Post-op Vital signs reviewed  Last Vitals:  Filed Vitals:   02/26/12 1205  BP: 115/68  Pulse: 63  Temp: 36.5 C  Resp: 16    Post vital signs: Reviewed  Level of consciousness: sedated  Complications: No apparent anesthesia complications

## 2012-02-27 ENCOUNTER — Encounter (HOSPITAL_COMMUNITY): Payer: Self-pay | Admitting: Obstetrics and Gynecology

## 2012-03-17 ENCOUNTER — Ambulatory Visit (INDEPENDENT_AMBULATORY_CARE_PROVIDER_SITE_OTHER): Payer: BC Managed Care – PPO | Admitting: Obstetrics and Gynecology

## 2012-03-17 ENCOUNTER — Encounter: Payer: Self-pay | Admitting: Obstetrics and Gynecology

## 2012-03-17 VITALS — BP 100/60 | Temp 98.4°F | Ht 63.0 in | Wt 159.0 lb

## 2012-03-17 DIAGNOSIS — N920 Excessive and frequent menstruation with regular cycle: Secondary | ICD-10-CM

## 2012-03-17 NOTE — Progress Notes (Signed)
DATE OF SURGERY: 02/26/2012 TYPE OF SURGERY:Hysteroscopy D&C PAIN:No VAG BLEEDING: no VAG DISCHARGE: no NORMAL GI FUNCTN: yes NORMAL GU FUNCTN: yes  Filed Vitals:   03/17/12 1023  BP: 100/60  Temp: 98.4 F (36.9 C)   1. Cervix, polyp - BENIGN MIXED ENDOCERVICAL AND ENDOMETRIAL POLYP, NO EVIDENCE OF ATYPIA, DYSPLASIA, HYPERPLASIA OR MALIGNANCY. 2. Endometrial polyp - BENIGN ENDOMETRIAL POLYP, NO EVIDENCE OF ATYPIA, HYPERPLASIA OR MALIGNANCY. 3. Endometrium, curettage - BENIGN PROLIFERATIVE ENDOMETRIUM, NO ATYPIA, HYPERPLASIA OR MALIGNANCY.   ROS: noncontributory  Pelvic exam:  VULVA: normal appearing vulva with no masses, tenderness or lesions,  VAGINA: normal appearing vagina with normal color and discharge, no lesions, CERVIX: normal appearing cervix without discharge or lesions,  UTERUS: uterus is normal size, shape, consistency and nontender,  ADNEXA: normal adnexa in size, nontender and no masses.  A/P Pt thinking about trying to conceive but has h/o probable PCOS If no menses in next , may try provera with neg preg test If no conception in next 8-13mths, rto for infertility w/u, will likely need clomid

## 2012-03-17 NOTE — Progress Notes (Deleted)
Vag. Discharge:{yes no:314532} Odor:{yes no:314532} Fever:{yes no:314532} Irreg.Periods:{yes no:314532} Dyspareunia:{yes no:314532} Dysuria:{yes no:314532} Frequency:{yes no:314532} Urgency:{yes no:314532} Hematuria:{yes no:314532} Kidney stones:{yes no:314532} Constipation:{yes no:314532} Diarrhea:{yes no:314532} Rectal Bleeding: {yes no:314532} Vomiting:{yes no:314532} Nausea:{yes no:314532} Pregnant:{yes no:314532} Fibroids:{yes no:314532} Endometriosis:{yes no:314532} Hx of Ovarian Cyst:{yes no:314532} Hx IUD:{yes no:314532} Hx STD-PID:{yes no:314532} Appendectomy:{yes no:314532} Gall Bladder Dz:{yes no:314532}  

## 2012-11-09 ENCOUNTER — Ambulatory Visit: Payer: BC Managed Care – PPO | Admitting: Family Medicine

## 2013-01-15 ENCOUNTER — Telehealth: Payer: Self-pay

## 2013-01-15 NOTE — Telephone Encounter (Signed)
LM for CB  HM UTD WE due for flu vaccine

## 2013-01-18 ENCOUNTER — Encounter: Payer: Self-pay | Admitting: Family Medicine

## 2013-01-18 ENCOUNTER — Ambulatory Visit (INDEPENDENT_AMBULATORY_CARE_PROVIDER_SITE_OTHER): Payer: Private Health Insurance - Indemnity | Admitting: Family Medicine

## 2013-01-18 VITALS — BP 110/68 | HR 63 | Temp 98.5°F | Ht 63.0 in | Wt 161.4 lb

## 2013-01-18 DIAGNOSIS — Z0000 Encounter for general adult medical examination without abnormal findings: Secondary | ICD-10-CM

## 2013-01-18 LAB — HEPATIC FUNCTION PANEL
ALT: 15 U/L (ref 0–35)
AST: 18 U/L (ref 0–37)
Bilirubin, Direct: 0.1 mg/dL (ref 0.0–0.3)
Total Protein: 7.1 g/dL (ref 6.0–8.3)

## 2013-01-18 LAB — CBC WITH DIFFERENTIAL/PLATELET
Basophils Absolute: 0 10*3/uL (ref 0.0–0.1)
Basophils Relative: 0.9 % (ref 0.0–3.0)
Hemoglobin: 13.3 g/dL (ref 12.0–15.0)
Lymphocytes Relative: 30.8 % (ref 12.0–46.0)
Monocytes Relative: 7.1 % (ref 3.0–12.0)
Neutro Abs: 2.3 10*3/uL (ref 1.4–7.7)
RBC: 4.3 Mil/uL (ref 3.87–5.11)

## 2013-01-18 LAB — LIPID PANEL
Cholesterol: 173 mg/dL (ref 0–200)
LDL Cholesterol: 98 mg/dL (ref 0–99)
Total CHOL/HDL Ratio: 5

## 2013-01-18 LAB — BASIC METABOLIC PANEL
BUN: 11 mg/dL (ref 6–23)
CO2: 27 mEq/L (ref 19–32)
Chloride: 109 mEq/L (ref 96–112)
Creatinine, Ser: 0.8 mg/dL (ref 0.4–1.2)
Potassium: 3.9 mEq/L (ref 3.5–5.1)

## 2013-01-18 NOTE — Progress Notes (Signed)
  Subjective:    Patient ID: Rachael Chandler, female    DOB: [birth year], 32 y.o.   MRN: [id number]  HPI CPE- GYN is Dr Su Hilt.   Review of Systems Patient reports no vision/ hearing changes, adenopathy, fever, weight change,  persistant/recurrent hoarseness, swallowing issues, chest pain, palpitations, edema, persistant/recurrent cough, hemoptysis, dyspnea (rest/exertional/paroxysmal nocturnal), gastrointestinal bleeding (melena, rectal bleeding), abdominal pain, significant heartburn, bowel changes, GU symptoms (dysuria, hematuria, incontinence), Gyn symptoms (abnormal  bleeding, pain), syncope, focal weakness, memory loss, numbness & tingling, skin/hair/nail changes, abnormal bruising or bleeding, anxiety, or depression.     Objective:   Physical Exam General Appearance:    Alert, cooperative, no distress, appears stated age  Head:    Normocephalic, without obvious abnormality, atraumatic  Eyes:    PERRL, conjunctiva/corneas clear, EOM's intact, fundi    benign, both eyes  Ears:    Normal TM's and external ear canals, both ears  Nose:   Nares normal, septum midline, mucosa normal, no drainage    or sinus tenderness  Throat:   Lips, mucosa, and tongue normal; teeth and gums normal  Neck:   Supple, symmetrical, trachea midline, no adenopathy;    Thyroid: no enlargement/tenderness/nodules  Back:     Symmetric, no curvature, ROM normal, no CVA tenderness  Lungs:     Clear to auscultation bilaterally, respirations unlabored  Chest Wall:    No tenderness or deformity   Heart:    Regular rate and rhythm, S1 and S2 normal, no murmur, rub   or gallop  Breast Exam:    Deferred to GYN  Abdomen:     Soft, non-tender, bowel sounds active all four quadrants,    no masses, no organomegaly  Genitalia:    Deferred to GYN  Rectal:    Extremities:   Extremities normal, atraumatic, no cyanosis or edema  Pulses:   2+ and symmetric all extremities  Skin:   Skin color, texture, turgor normal, no rashes  or lesions  Lymph nodes:   Cervical, supraclavicular, and axillary nodes normal  Neurologic:   CNII-XII intact, normal strength, sensation and reflexes    throughout          Assessment & Plan:

## 2013-01-18 NOTE — Assessment & Plan Note (Signed)
Pt's PE WNL.  Check labs.  Pt to schedule w/ GYN.  Anticipatory guidance provided.

## 2013-01-18 NOTE — Patient Instructions (Addendum)
Follow up in 1 year or as needed Keep up the good work!  You look great! We'll notify you of your lab results and make any changes if needed Call with any questions or concerns Happy [birth year] Birthday!!!

## 2013-01-19 NOTE — Telephone Encounter (Signed)
Unable to reach prior to visit  

## 2013-01-21 ENCOUNTER — Encounter: Payer: Self-pay | Admitting: General Practice

## 2013-01-21 LAB — VITAMIN D 1,25 DIHYDROXY
Vitamin D 1, 25 (OH)2 Total: 42 pg/mL (ref 18–72)
Vitamin D2 1, 25 (OH)2: <8 pg/mL
Vitamin D3 1, 25 (OH)2: 42 pg/mL

## 2013-04-02 ENCOUNTER — Encounter: Payer: Self-pay | Admitting: Family Medicine

## 2013-04-02 ENCOUNTER — Ambulatory Visit (INDEPENDENT_AMBULATORY_CARE_PROVIDER_SITE_OTHER): Payer: Private Health Insurance - Indemnity | Admitting: Family Medicine

## 2013-04-02 VITALS — BP 118/74 | HR 85 | Temp 98.1°F | Resp 16 | Wt 160.0 lb

## 2013-04-02 DIAGNOSIS — J329 Chronic sinusitis, unspecified: Secondary | ICD-10-CM

## 2013-04-02 MED ORDER — AMOXICILLIN 875 MG PO TABS: 875.0000 mg | ORAL_TABLET | Freq: Two times a day (BID) | ORAL | Status: DC

## 2013-04-02 NOTE — Assessment & Plan Note (Signed)
Pt's sxs and PE consistent w/ infxn.  Start abx.  Reviewed supportive care and red flags that should prompt return.  Pt expressed understanding and is in agreement w/ plan.  

## 2013-04-02 NOTE — Patient Instructions (Signed)
Follow up as needed Start the Amoxicillin twice daily- take w/ food Drink plenty of fluids REST! Call with any questions or concerns Hang in there!! Happy Holidays!!! 

## 2013-04-02 NOTE — Progress Notes (Signed)
   Subjective:    Patient ID: Rachael Chandler, female    DOB: [birth year], 32 y.o.   MRN: [id number]  HPI URI- sxs started 6 days ago w/ sore throat.  Now w/ facial pain/pressure, nasal drainage, HA.  Intermittent ear fullness.  No fevers.  + tooth pain.  No N/V/D.  No known sick contacts.  Intermittent cough- not severe.   Review of Systems For ROS see HPI     Objective:   Physical Exam  Vitals reviewed. Constitutional: She appears well-developed and well-nourished. No distress.  HENT:  Head: Normocephalic and atraumatic.  Right Ear: Tympanic membrane normal.  Left Ear: Tympanic membrane normal.  Nose: Mucosal edema and rhinorrhea present. Right sinus exhibits maxillary sinus tenderness and frontal sinus tenderness. Left sinus exhibits maxillary sinus tenderness and frontal sinus tenderness.  Mouth/Throat: Uvula is midline and mucous membranes are normal. Posterior oropharyngeal erythema present. No oropharyngeal exudate.  Eyes: Conjunctivae and EOM are normal. Pupils are equal, round, and reactive to light.  Neck: Normal range of motion. Neck supple.  Cardiovascular: Normal rate, regular rhythm and normal heart sounds.   Pulmonary/Chest: Effort normal and breath sounds normal. No respiratory distress. She has no wheezes.  Lymphadenopathy:    She has no cervical adenopathy.          Assessment & Plan:

## 2013-06-14 ENCOUNTER — Other Ambulatory Visit: Payer: Self-pay | Admitting: Obstetrics and Gynecology

## 2013-06-14 DIAGNOSIS — N63 Unspecified lump in unspecified breast: Secondary | ICD-10-CM

## 2013-06-28 ENCOUNTER — Ambulatory Visit
Admission: RE | Admit: 2013-06-28 | Discharge: 2013-06-28 | Disposition: A | Discharge status: Home / Self Care | Payer: Private Health Insurance - Indemnity | Source: Ambulatory Visit | Attending: Obstetrics and Gynecology | Admitting: Obstetrics and Gynecology

## 2013-06-28 DIAGNOSIS — N63 Unspecified lump in unspecified breast: Secondary | ICD-10-CM

## 2013-11-22 LAB — OB RESULTS CONSOLE RPR: RPR: NONREACTIVE

## 2013-11-22 LAB — OB RESULTS CONSOLE GC/CHLAMYDIA
CHLAMYDIA, DNA PROBE: NEGATIVE
Gonorrhea: NEGATIVE

## 2013-11-22 LAB — OB RESULTS CONSOLE ANTIBODY SCREEN: Antibody Screen: NEGATIVE

## 2013-11-22 LAB — OB RESULTS CONSOLE ABO/RH: RH Type: POSITIVE

## 2013-11-22 LAB — OB RESULTS CONSOLE HEPATITIS B SURFACE ANTIGEN: Hepatitis B Surface Ag: NEGATIVE

## 2013-11-22 LAB — OB RESULTS CONSOLE RUBELLA ANTIBODY, IGM: RUBELLA: IMMUNE

## 2013-11-22 LAB — OB RESULTS CONSOLE HIV ANTIBODY (ROUTINE TESTING): HIV: NONREACTIVE

## 2014-01-31 ENCOUNTER — Other Ambulatory Visit: Payer: Self-pay

## 2014-03-28 ENCOUNTER — Encounter: Payer: Self-pay | Admitting: Medical

## 2014-03-28 ENCOUNTER — Ambulatory Visit (INDEPENDENT_AMBULATORY_CARE_PROVIDER_SITE_OTHER): Payer: Private Health Insurance - Indemnity | Admitting: Medical

## 2014-03-28 VITALS — BP 105/70 | HR 73 | Temp 97.7°F | Ht 63.0 in | Wt 167.0 lb

## 2014-03-28 DIAGNOSIS — R300 Dysuria: Secondary | ICD-10-CM

## 2014-03-28 DIAGNOSIS — R339 Retention of urine, unspecified: Secondary | ICD-10-CM

## 2014-03-28 LAB — POCT URINALYSIS DIPSTICK
Glucose, UA: NEGATIVE
Ketones, UA: NEGATIVE
LEUKOCYTES UA: NEGATIVE
NITRITE UA: NEGATIVE
Spec Grav, UA: 1.025
Urobilinogen, UA: 0.2
pH, UA: 6.5

## 2014-03-28 MED ORDER — NITROFURANTOIN MONOHYD MACRO 100 MG PO CAPS: 100.0000 mg | ORAL_CAPSULE | Freq: Two times a day (BID) | ORAL | Status: DC

## 2014-03-28 NOTE — Progress Notes (Signed)
Subjective:    Patient ID: Rachael Chandler, female    DOB: 1980-06-17, 33 y.o.   MRN: [id number]  HPI   Pt is 26 weeks. She pregnant with boy. This is her first pregnancy. Pt has some pain on urination and some frequent urinary symptoms. This has been going on since Sunday. No fevers or chills. Pt had one urinary infection before one time in the past about 10 years ago. She just took antibiotics and was fine.  Pt states urinated maybe 8-9 times since yesterday. Today urinated about 3-4 times today. Every time she urinates it does hurt.  Pt Dr. Sheral ApleyB is Dr. Billy Coastaavon. [phone].  Past Medical History  Diagnosis Date  . Ulcer     clinical dx; no Endoscopy  . Kidney stones      x 1  h/o  . Sciatica of left side   . GERD (gastroesophageal reflux disease)   . Headache(784.0)     otc meds prn    History   Social History  . Marital Status: Married    Spouse Name: N/A    Number of Children: N/A  . Years of Education: N/A   Occupational History  . Not on file.   Social History Main Topics  . Smoking status: Never Smoker   . Smokeless tobacco: Never Used  . Alcohol Use: Yes     Comment: 2-3 drinks per week  . Drug Use: No  . Sexual Activity: Yes    Birth Control/ Protection: None   Other Topics Concern  . Not on file   Social History Narrative    Past Surgical History  Procedure Laterality Date  . Fractured arm  1986    left  . Wisdom tooth extraction    . Dilatation & currettage/hysteroscopy with resectocope  02/26/2012    Procedure: DILATATION & CURETTAGE/HYSTEROSCOPY WITH RESECTOCOPE;  Surgeon: Purcell NailsAngela Y Roberts, MD;  Location: [facility];  Service: Gynecology;  Laterality: N/A;       Family History  Problem Relation Age of Onset  . Alcohol abuse Father   . Alcohol abuse Mother   . Sudden death Mother     bleeding internal and could not do surgery because of stomach cancer  . Stomach cancer Mother   . Alcohol abuse Maternal Grandmother   . Hyperlipidemia Maternal  Grandmother   . Hypertension Maternal Grandmother   . Diabetes Maternal Grandmother   . Renal Disease Maternal Grandmother   . Heart disease Paternal Grandmother     No Known Allergies  No current outpatient prescriptions on file prior to visit.   No current facility-administered medications on file prior to visit.    BP 105/70 mmHg  Pulse 73  Temp(Src) 97.7 F (36.5 C) (Oral)  Ht 5\' 3"  (1.6 m)  Wt 167 lb (75.751 kg)  BMI 29.59 kg/m2  SpO2 100%  LMP 05/31/2013      Review of Systems  Constitutional: Negative for fever, chills and fatigue.  Respiratory: Negative for cough, choking, chest tightness and shortness of breath.   Cardiovascular: Negative for chest pain.  Gastrointestinal: Negative for nausea, vomiting, abdominal pain, blood in stool and abdominal distention.  Genitourinary: Positive for dysuria and frequency. Negative for urgency, hematuria, flank pain, decreased urine volume, vaginal bleeding, vaginal discharge, difficulty urinating, vaginal pain and pelvic pain.       Pt does not describe urinary retention on discussion with me rather frequent urination.  Musculoskeletal: Negative for back pain.  Skin: Negative for rash.  Neurological: Negative  for dizziness and headaches.  Hematological: Negative for adenopathy. Does not bruise/bleed easily.       Objective:   Physical Exam   General  Mental Status- Alert. Orientation- Orientation x 4.   Skin General:- Normal. Moisture- Dry. Temperature- Warm.  HEENT Head- normal.  Neck Neck- Supple.  Heart Ausculation-RRR  Lungs Ausculation- Clear, even, unlabored bilaterlly.    Abdomen Palpation:(appears obviously pregnant) Palpation  of the abdomen reveal- Non Tender, No Rebound tenderness, No Rigidity(guarding). No suprapubic tenderness. Liver:-Normal. Spleen:- Normal. Other Characteristics- No Costovertebral angle tenderness- Left or Costovertebral angle tenderness- Right.  Auscultation:  Auscultation of the abdomen reveals- Bowel Sounds normal.     Back- no cva tenderness        Assessment & Plan:

## 2014-03-28 NOTE — Progress Notes (Signed)
Pre visit review using our clinic review tool, if applicable. No additional management support is needed unless otherwise documented below in the visit note. 

## 2014-03-28 NOTE — Patient Instructions (Addendum)
You have some urinary infection symptoms over last 24 hours but your ua test today does not show leukocytes or nitrates. Your urine culture is pending and that will determine if bacterial infection present. We will call you with results when they are in.  I will go ahead and prescribe macrobid 100 mg #10 1 tab po bid x 5 days. This is the antibiotic of choice that your OB uses for uti  per you Obstetrician.  Currently I would hydrate well and drink cranberry juice. If your symptoms worsen in terms of dysuria, urinary frequency the start then start the  macrobid. Or if your symptoms change back pain, suprapubic pain, fevers or chills then start macrobid as well.  Follow up in 7 days with ob or as needed here.  I will send your urine culture results to your ob.  If you have any vaginal bleeding or cramping then ED evaluation.   Note regarding the pt  wiping with cleaner(wipe) before the clean catch. I discussed this with her pcp. Dr. Beverely Lowabori.  Dr Beverely Lowabori  recommended the patient  rinse/wash off with soap and water. Pt states that she did this immediately when she read what the wipe was for.

## 2014-03-29 LAB — URINE CULTURE
Colony Count: NO GROWTH
Organism ID, Bacteria: NO GROWTH

## 2014-03-30 ENCOUNTER — Telehealth: Payer: Self-pay | Admitting: Medical

## 2014-03-30 NOTE — Telephone Encounter (Signed)
I called pt again and still getting the message that her phone is not set up to take calls. I could not leave a message.

## 2014-03-30 NOTE — Telephone Encounter (Signed)
I did finally did get through and was able to leave a message. I advised pt that her culture showed no growth and that based on the culture I did not want her to take the antibiotic. I want her to call her ob and be seen for further evaluation as to what the cause of her symptoms are. Explained that OB need to further work her up. I explained I want her to call and try to be seen tomorrow.   I later called pt and did talke with pt. She states that she is completely asymptomatic now. She called her ob and they stated she did not need to be seen.

## 2014-03-30 NOTE — Telephone Encounter (Signed)
I tried to call patient twice this am. The message on her phone states the person I am trying to call is not taking calls presently(I could not leave a message). I was going to call her and advise the urine culture was negative. Ask her to see her ob for further work up as to cause of her symptoms. I will try to call her ob office and let them know culture results and ask if they would see her for further work up.

## 2014-03-30 NOTE — Telephone Encounter (Signed)
I called ob office and notified them of pt urine culture that came back as no growth. Staff that answered phone will pass message to nurse. I asked that pt be seen for further work up since results of urine showed no growth. Ob office will try to call pt. I sent message to lpn Clydie BraunKaren as well and she will try to contact pt.

## 2014-05-09 ENCOUNTER — Telehealth: Payer: Self-pay | Admitting: Hematology and Oncology

## 2014-05-09 NOTE — Telephone Encounter (Signed)
left message for patient and gave np appt for 01/21 @ 1:30 w/Dr. Bertis RuddyGorsuch.

## 2014-05-11 ENCOUNTER — Other Ambulatory Visit: Payer: Self-pay | Admitting: Hematology and Oncology

## 2014-05-11 ENCOUNTER — Telehealth: Payer: Self-pay | Admitting: Hematology and Oncology

## 2014-05-11 ENCOUNTER — Encounter: Payer: Self-pay | Admitting: Hematology and Oncology

## 2014-05-11 DIAGNOSIS — D693 Immune thrombocytopenic purpura: Secondary | ICD-10-CM

## 2014-05-11 DIAGNOSIS — O99113 Other diseases of the blood and blood-forming organs and certain disorders involving the immune mechanism complicating pregnancy, third trimester: Principal | ICD-10-CM

## 2014-05-11 HISTORY — DX: Other diseases of the blood and blood-forming organs and certain disorders involving the immune mechanism complicating pregnancy, third trimester: D69.3

## 2014-05-11 NOTE — Telephone Encounter (Signed)
Left message for patient to return call.

## 2014-05-12 ENCOUNTER — Ambulatory Visit: Payer: Medicare HMO

## 2014-05-12 ENCOUNTER — Ambulatory Visit (HOSPITAL_BASED_OUTPATIENT_CLINIC_OR_DEPARTMENT_OTHER): Payer: Medicare HMO | Admitting: Hematology and Oncology

## 2014-05-12 ENCOUNTER — Other Ambulatory Visit (HOSPITAL_BASED_OUTPATIENT_CLINIC_OR_DEPARTMENT_OTHER): Payer: Medicare HMO

## 2014-05-12 ENCOUNTER — Telehealth: Payer: Self-pay | Admitting: *Deleted

## 2014-05-12 ENCOUNTER — Encounter: Payer: Self-pay | Admitting: Hematology and Oncology

## 2014-05-12 VITALS — BP 113/80 | HR 84 | Temp 98.1°F | Resp 18 | Ht 63.0 in | Wt 173.6 lb

## 2014-05-12 DIAGNOSIS — D693 Immune thrombocytopenic purpura: Secondary | ICD-10-CM

## 2014-05-12 DIAGNOSIS — O99113 Other diseases of the blood and blood-forming organs and certain disorders involving the immune mechanism complicating pregnancy, third trimester: Secondary | ICD-10-CM

## 2014-05-12 DIAGNOSIS — R040 Epistaxis: Secondary | ICD-10-CM

## 2014-05-12 LAB — COMPREHENSIVE METABOLIC PANEL (CC13)
ALBUMIN: 3.3 g/dL — AB (ref 3.5–5.0)
ALK PHOS: 75 U/L (ref 40–150)
ALT: 16 U/L (ref 0–55)
ANION GAP: 9 meq/L (ref 3–11)
AST: 19 U/L (ref 5–34)
BUN: 8.3 mg/dL (ref 7.0–26.0)
CO2: 21 mEq/L — ABNORMAL LOW (ref 22–29)
Calcium: 9.6 mg/dL (ref 8.4–10.4)
Chloride: 108 mEq/L (ref 98–109)
Creatinine: 0.6 mg/dL (ref 0.6–1.1)
GLUCOSE: 66 mg/dL — AB (ref 70–140)
Potassium: 4.1 mEq/L (ref 3.5–5.1)
SODIUM: 138 meq/L (ref 136–145)
TOTAL PROTEIN: 6.8 g/dL (ref 6.4–8.3)
Total Bilirubin: 0.68 mg/dL (ref 0.20–1.20)

## 2014-05-12 LAB — CBC WITH DIFFERENTIAL/PLATELET
BASO%: 0.2 % (ref 0.0–2.0)
Basophils Absolute: 0 10*3/uL (ref 0.0–0.1)
EOS ABS: 0.2 10*3/uL (ref 0.0–0.5)
EOS%: 1.9 % (ref 0.0–7.0)
HEMATOCRIT: 36.6 % (ref 34.8–46.6)
HEMOGLOBIN: 12.7 g/dL (ref 11.6–15.9)
LYMPH%: 13.9 % — AB (ref 14.0–49.7)
MCH: 33.5 pg (ref 25.1–34.0)
MCHC: 34.7 g/dL (ref 31.5–36.0)
MCV: 96.6 fL (ref 79.5–101.0)
MONO#: 0.6 10*3/uL (ref 0.1–0.9)
MONO%: 7.2 % (ref 0.0–14.0)
NEUT#: 6.6 10*3/uL — ABNORMAL HIGH (ref 1.5–6.5)
NEUT%: 76.8 % (ref 38.4–76.8)
RBC: 3.79 10*6/uL (ref 3.70–5.45)
RDW: 13.7 % (ref 11.2–14.5)
WBC: 8.6 10*3/uL (ref 3.9–10.3)
lymph#: 1.2 10*3/uL (ref 0.9–3.3)
nRBC: 0 % (ref 0–0)

## 2014-05-12 LAB — TECHNOLOGIST REVIEW

## 2014-05-12 MED ORDER — PREDNISONE 20 MG PO TABS: 80.0000 mg | ORAL_TABLET | Freq: Every day | ORAL | Status: DC

## 2014-05-12 NOTE — Progress Notes (Signed)
[facility] NOTE  Patient Care Team: Midge Minium, MD as PCP - General (Family Medicine) Lovenia Kim, MD as Consulting Physician (Obstetrics and Gynecology) Heath Lark, MD as Consulting Physician (Hematology and Oncology)  CHIEF COMPLAINTS/PURPOSE OF CONSULTATION:  Thrombocytopenia complicating pregnancy  HISTORY OF PRESENTING ILLNESS:  Rachael Chandler 34 y.o. female is here because of thrombocytopenia.  She was found to have abnormal CBC from routine blood work monitoring. I have reviewed her records from 2012 and she does not have thrombocytopenia up until recently. She is currently pregnant at [redacted] weeks, her first pregnancy with expected due date of 07/03/2014. The patient have recurrent epistaxis since childhood and had repeat history of cauterization of bleeding vessels in the past. Currently, she have epistaxis almost on a regular basis. The amount of blood from each episode is mild and it usually self-limiting, stop within a few minutes About 2 weeks ago, she has some nasal congestion and flulike symptoms. She denies recent bruising/bleeding, such as spontaneous hematuria, melena or hematochezia She had history of menorrhagia The patient denies history of liver disease, exposure to heparin, history of cardiac murmur/prior cardiovascular surgery or recent new medications She denies prior blood or platelet transfusions   MEDICAL HISTORY:  Past Medical History  Diagnosis Date  . Ulcer     clinical dx; no Endoscopy  . Kidney stones      x 1  h/o  . Sciatica of left side   . GERD (gastroesophageal reflux disease)   . Headache(784.0)     otc meds prn  . 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 Fractured arm  1986    left  . Wisdom tooth extraction    . Dilatation & currettage/hysteroscopy with resectocope  02/26/2012    Procedure:  [place];  Surgeon: Delice Lesch, MD;  Location: [facility];  Service: Gynecology;  Laterality: N/A;     . Cautherization      SOCIAL HISTORY: History   Social History  . Marital Status: Married    Spouse Name: N/A    Number of Children: N/A  . Years of Education: N/A   Occupational History  . Not on file.   Social History Main Topics  . Smoking status: Never Smoker   . Smokeless tobacco: Never Used  . Alcohol Use: Yes     Comment: 2-3 drinks per week  . Drug Use: No  . Sexual Activity: Yes    Birth Control/ Protection: None   Other Topics Concern  . Not on file   Social History Narrative    FAMILY HISTORY: Family History  Problem Relation Age of Onset  . Alcohol abuse Father   . Alcohol abuse Mother   . Sudden death Mother     bleeding internal and could not do surgery because of stomach cancer  . Stomach cancer Mother   . Alcohol abuse Maternal Grandmother   . Hyperlipidemia Maternal Grandmother   . Hypertension Maternal Grandmother   . Diabetes Maternal Grandmother   . Renal Disease Maternal Grandmother   . Heart disease Paternal Grandmother     ALLERGIES:  has No Known Allergies.  MEDICATIONS:  Current Outpatient Prescriptions  Medication Sig Dispense Refill  . cetirizine (ZYRTEC) 10 MG tablet Take 10 mg by mouth daily.    . Prenatal Vit-Fe Fumarate-FA (PRENATAL MULTIVITAMIN) TABS tablet Take 1 tablet by mouth daily at  12 noon.    . predniSONE (DELTASONE) 20 MG tablet Take 4 tablets (80 mg total) by mouth daily with breakfast. 60 tablet 0   No current facility-administered medications for this visit.    REVIEW OF SYSTEMS:   Constitutional: Denies fevers, chills or abnormal night sweats Eyes: Denies blurriness of vision, double vision or watery eyes Ears, nose, mouth, throat, and face: Denies mucositis or sore throat Respiratory: Denies cough, dyspnea or wheezes Cardiovascular: Denies palpitation, chest  discomfort or lower extremity swelling Gastrointestinal:  Denies nausea, heartburn or change in bowel habits Skin: Denies abnormal skin rashes Lymphatics: Denies new lymphadenopathy or easy bruising Neurological:Denies numbness, tingling or new weaknesses Behavioral/Psych: Mood is stable, no new changes  All other systems were reviewed with the patient and are negative.  PHYSICAL EXAMINATION: ECOG PERFORMANCE STATUS: 0 - Asymptomatic  Filed Vitals:   05/12/14 1415  BP: 113/80  Pulse: 84  Temp: 98.1 F (36.7 C)  Resp: 18   Filed Weights   05/12/14 1415  Weight: 173 lb 9.6 oz (78.744 kg)    GENERAL:alert, no distress and comfortable SKIN: skin color, texture, turgor are normal, no rashes or significant lesions EYES: normal, conjunctiva are pink and non-injected, sclera clear OROPHARYNX:no exudate, no erythema and lips, buccal mucosa, and tongue normal  NECK: supple, thyroid normal size, non-tender, without nodularity LYMPH:  no palpable lymphadenopathy in the cervical, axillary or inguinal LUNGS: clear to auscultation and percussion with normal breathing effort HEART: regular rate & rhythm and no murmurs and no lower extremity edema ABDOMEN:abdomen soft, non-tender and normal bowel sounds. She has a pregnant uterus Musculoskeletal:no cyanosis of digits and no clubbing  PSYCH: alert & oriented x 3 with fluent speech NEURO: no focal motor/sensory deficits  LABORATORY DATA:  I have reviewed the data as listed Recent Results (from the past 2160 hour(s))  POCT Urinalysis Dipstick     Status: Abnormal   Collection Time: 03/28/14  4:28 PM  Result Value Ref Range   Color, UA Yellow    Clarity, UA Clear    Glucose, UA Neg    Bilirubin, UA Small    Ketones, UA Neg    Spec Grav, UA 1.025    Blood, UA Trace    pH, UA 6.5    Protein, UA Trace    Urobilinogen, UA 0.2    Nitrite, UA Neg    Leukocytes, UA Negative   Urine Culture     Status: None   Collection Time: 03/28/14   5:06 PM  Result Value Ref Range   Colony Count NO GROWTH    Organism ID, Bacteria NO GROWTH   Comprehensive metabolic panel     Status: Abnormal   Collection Time: 05/12/14  1:46 PM  Result Value Ref Range   Sodium 138 136 - 145 mEq/L   Potassium 4.1 3.5 - 5.1 mEq/L   Chloride 108 98 - 109 mEq/L   CO2 21 (L) 22 - 29 mEq/L   Glucose 66 (L) 70 - 140 mg/dl   BUN 8.3 7.0 - 26.0 mg/dL   Creatinine 0.6 0.6 - 1.1 mg/dL   Total Bilirubin 0.68 0.20 - 1.20 mg/dL   Alkaline Phosphatase 75 40 - 150 U/L   AST 19 5 - 34 U/L   ALT 16 0 - 55 U/L   Total Protein 6.8 6.4 - 8.3 g/dL   Albumin 3.3 (L) 3.5 - 5.0 g/dL   Calcium 9.6 8.4 - 10.4 mg/dL   Anion Gap 9 3 - 11 mEq/L  EGFR >90 >90 ml/min/1.73 m2    Comment: eGFR is calculated using the CKD-EPI Creatinine Equation (2009)  CBC with Differential     Status: Abnormal   Collection Time: 05/12/14  1:47 PM  Result Value Ref Range   WBC 8.6 3.9 - 10.3 10e3/uL   NEUT# 6.6 (H) 1.5 - 6.5 10e3/uL   HGB 12.7 11.6 - 15.9 g/dL   HCT 36.6 34.8 - 46.6 %   Platelets 128 Platelet count consistent in citrate (L) 145 - 400 10e3/uL   MCV 96.6 79.5 - 101.0 fL   MCH 33.5 25.1 - 34.0 pg   MCHC 34.7 31.5 - 36.0 g/dL   RBC 3.79 3.70 - 5.45 10e6/uL   RDW 13.7 11.2 - 14.5 %   lymph# 1.2 0.9 - 3.3 10e3/uL   MONO# 0.6 0.1 - 0.9 10e3/uL   Eosinophils Absolute 0.2 0.0 - 0.5 10e3/uL   Basophils Absolute 0.0 0.0 - 0.1 10e3/uL   NEUT% 76.8 38.4 - 76.8 %   LYMPH% 13.9 (L) 14.0 - 49.7 %   MONO% 7.2 0.0 - 14.0 %   EOS% 1.9 0.0 - 7.0 %   BASO% 0.2 0.0 - 2.0 %   nRBC 0 0 - 0 %  TECHNOLOGIST REVIEW     Status: None   Collection Time: 05/12/14  1:47 PM  Result Value Ref Range   Technologist Review Occ Metas and Myelocytes present     ASSESSMENT & PLAN 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 Her peripheral smear confirmed mild thrombocytopenia, likely related to pregnancy. I do not believe the epistaxis is related to  this. As long as her platelet count is above 100,000, she does not require any treatment. I recommend weekly CBC drawn starting at 36 weeks pregnancy. The patient is not sure whether she once a comeback for blood monitoring since she is getting blood drawn and her obstetrician office on a regular basis. I gave her prescription of prednisone to hang onto just in case she needs to be started on prednisone, should her platelet count dropped to less than 100,000. If that happens, the patient needs to inform me immediately. We discussed the risk, benefit, side effects of prednisone and she agreed with the plan of care. If her platelet count remained well over 100,000 and she has a successful delivery, she does not need to come back for further follow-up. A sizer plaque on his above 100,000 throughout pregnancy, she should not have contraindication to proceed with cesarean section or epidural injection.   Epistaxis, recurrent I do not believe the recurrent epistaxis is related to her low platelet count. It is very well possible she may have some anatomical defect that may require repeat cauterization in the near future. In the meantime, I recommend a trial of humidifier at home and to dab small amount of Vaseline at the tip of the nose to prevent it from getting too dry, which precipitate another episode of heavy bleeding.

## 2014-05-12 NOTE — Assessment & Plan Note (Signed)
I do not believe the recurrent epistaxis is related to her low platelet count. It is very well possible she may have some anatomical defect that may require repeat cauterization in the near future. In the meantime, I recommend a trial of humidifier at home and to dab small amount of Vaseline at the tip of the nose to prevent it from getting too dry, which precipitate another episode of heavy bleeding.

## 2014-05-12 NOTE — Assessment & Plan Note (Signed)
She is not symptomatic. Her peripheral smear confirmed mild thrombocytopenia, likely related to pregnancy. I do not believe the epistaxis is related to this. As long as her platelet count is above 100,000, she does not require any treatment. I recommend weekly CBC drawn starting at 36 weeks pregnancy. The patient is not sure whether she once a comeback for blood monitoring since she is getting blood drawn and her obstetrician office on a regular basis. I gave her prescription of prednisone to hang onto just in case she needs to be started on prednisone, should her platelet count dropped to less than 100,000. If that happens, the patient needs to inform me immediately. We discussed the risk, benefit, side effects of prednisone and she agreed with the plan of care. If her platelet count remained well over 100,000 and she has a successful delivery, she does not need to come back for further follow-up. A sizer plaque on his above 100,000 throughout pregnancy, she should not have contraindication to proceed with cesarean section or epidural injection.

## 2014-05-12 NOTE — Telephone Encounter (Signed)
Pt saw Dr. Bertis RuddyGorsuch today as a new patient.  Pt left message wanting to know of any dietary intake to help with platelet improvement.  Spoke with pt.  Instructed pt to monitor for any signs of bleeding and to notifiy office.  Went over instructions from Dr. Maxine GlennGorsuch's notes today with pt.  Pt voiced understanding that there are no foods would help increase platelets.

## 2014-05-12 NOTE — Progress Notes (Signed)
Checked in new pt with no financial concerns prior to seeing the doctor.  Pt is here for a hematology concern so financial assistance may not be needed but she has my card for any billing or insurance questions or concerns.

## 2014-05-27 LAB — OB RESULTS CONSOLE GBS: GBS: NEGATIVE

## 2014-06-23 ENCOUNTER — Inpatient Hospital Stay (HOSPITAL_COMMUNITY)
Admission: AD | Admit: 2014-06-23 | Payer: Managed Care, Other (non HMO) | Source: Ambulatory Visit | Admitting: Obstetrics and Gynecology

## 2014-06-29 ENCOUNTER — Encounter (HOSPITAL_COMMUNITY): Payer: Self-pay | Admitting: *Deleted

## 2014-06-29 ENCOUNTER — Telehealth (HOSPITAL_COMMUNITY): Payer: Self-pay | Admitting: *Deleted

## 2014-06-29 NOTE — Telephone Encounter (Signed)
Preadmission screen  

## 2014-07-04 ENCOUNTER — Encounter (HOSPITAL_COMMUNITY): Payer: Self-pay | Admitting: General Practice

## 2014-07-04 ENCOUNTER — Inpatient Hospital Stay (HOSPITAL_COMMUNITY)
Admission: AD | Admit: 2014-07-04 | Discharge: 2014-07-08 | DRG: 765 | Disposition: A | Discharge status: Home / Self Care | Payer: Managed Care, Other (non HMO) | Source: Ambulatory Visit | Attending: Obstetrics and Gynecology | Admitting: Obstetrics and Gynecology

## 2014-07-04 ENCOUNTER — Other Ambulatory Visit: Payer: Self-pay | Admitting: Obstetrics and Gynecology

## 2014-07-04 DIAGNOSIS — O480 Post-term pregnancy: Principal | ICD-10-CM | POA: Diagnosis present

## 2014-07-04 DIAGNOSIS — Z3403 Encounter for supervision of normal first pregnancy, third trimester: Secondary | ICD-10-CM | POA: Diagnosis present

## 2014-07-04 DIAGNOSIS — D62 Acute posthemorrhagic anemia: Secondary | ICD-10-CM | POA: Diagnosis not present

## 2014-07-04 DIAGNOSIS — O9912 Other diseases of the blood and blood-forming organs and certain disorders involving the immune mechanism complicating childbirth: Secondary | ICD-10-CM | POA: Diagnosis present

## 2014-07-04 DIAGNOSIS — Z3A40 40 weeks gestation of pregnancy: Secondary | ICD-10-CM | POA: Diagnosis present

## 2014-07-04 DIAGNOSIS — D696 Thrombocytopenia, unspecified: Secondary | ICD-10-CM | POA: Diagnosis present

## 2014-07-04 LAB — CBC
HCT: 37.4 % (ref 36.0–46.0)
Hemoglobin: 13.4 g/dL (ref 12.0–15.0)
MCH: 35 pg — ABNORMAL HIGH (ref 26.0–34.0)
MCHC: 35.8 g/dL (ref 30.0–36.0)
MCV: 97.7 fL (ref 78.0–100.0)
Platelets: 116 10*3/uL — ABNORMAL LOW (ref 150–400)
RBC: 3.83 MIL/uL — AB (ref 3.87–5.11)
RDW: 13 % (ref 11.5–15.5)
WBC: 10.5 10*3/uL (ref 4.0–10.5)

## 2014-07-04 LAB — TYPE AND SCREEN
ABO/RH(D): O POS
ANTIBODY SCREEN: NEGATIVE

## 2014-07-04 MED ORDER — PHENYLEPHRINE 40 MCG/ML (10ML) SYRINGE FOR IV PUSH (FOR BLOOD PRESSURE SUPPORT): 80.0000 ug | PREFILLED_SYRINGE | INTRAVENOUS | Status: DC | PRN

## 2014-07-04 MED ORDER — OXYTOCIN 40 UNITS IN LACTATED RINGERS INFUSION - SIMPLE MED
1.0000 m[IU]/min | INTRAVENOUS | Status: DC
  Administered 2014-07-05: 2 m[IU]/min via INTRAVENOUS
  Filled 2014-07-04: qty 1000

## 2014-07-04 MED ORDER — PHENYLEPHRINE 40 MCG/ML (10ML) SYRINGE FOR IV PUSH (FOR BLOOD PRESSURE SUPPORT)
80.0000 ug | PREFILLED_SYRINGE | INTRAVENOUS | Status: DC | PRN
  Filled 2014-07-04: qty 20

## 2014-07-04 MED ORDER — FLEET ENEMA 7-19 GM/118ML RE ENEM: 1.0000 | ENEMA | RECTAL | Status: DC | PRN

## 2014-07-04 MED ORDER — OXYCODONE-ACETAMINOPHEN 5-325 MG PO TABS: 1.0000 | ORAL_TABLET | ORAL | Status: DC | PRN

## 2014-07-04 MED ORDER — EPHEDRINE 5 MG/ML INJ: 10.0000 mg | INTRAVENOUS | Status: DC | PRN

## 2014-07-04 MED ORDER — TERBUTALINE SULFATE 1 MG/ML IJ SOLN: 0.2500 mg | Freq: Once | INTRAMUSCULAR | Status: AC | PRN

## 2014-07-04 MED ORDER — OXYTOCIN 40 UNITS IN LACTATED RINGERS INFUSION - SIMPLE MED: 62.5000 mL/h | INTRAVENOUS | Status: DC

## 2014-07-04 MED ORDER — LACTATED RINGERS IV SOLN
INTRAVENOUS | Status: DC
  Administered 2014-07-05: 04:00:00 via INTRAVENOUS
  Administered 2014-07-05: 125 mL/h via INTRAVENOUS
  Administered 2014-07-05: 10:00:00 via INTRAVENOUS

## 2014-07-04 MED ORDER — LACTATED RINGERS IV SOLN: 500.0000 mL | Freq: Once | INTRAVENOUS | Status: DC

## 2014-07-04 MED ORDER — LACTATED RINGERS IV SOLN
500.0000 mL | INTRAVENOUS | Status: DC | PRN
  Administered 2014-07-05: 500 mL via INTRAVENOUS

## 2014-07-04 MED ORDER — ZOLPIDEM TARTRATE 5 MG PO TABS: 5.0000 mg | ORAL_TABLET | Freq: Every evening | ORAL | Status: DC | PRN

## 2014-07-04 MED ORDER — FENTANYL 2.5 MCG/ML BUPIVACAINE 1/10 % EPIDURAL INFUSION (WH - ANES)
14.0000 mL/h | INTRAMUSCULAR | Status: DC | PRN
  Administered 2014-07-05 (×2): 14 mL/h via EPIDURAL
  Filled 2014-07-04 (×2): qty 125

## 2014-07-04 MED ORDER — BUTORPHANOL TARTRATE 1 MG/ML IJ SOLN: 1.0000 mg | INTRAMUSCULAR | Status: DC | PRN

## 2014-07-04 MED ORDER — OXYTOCIN BOLUS FROM INFUSION: 500.0000 mL | INTRAVENOUS | Status: DC

## 2014-07-04 MED ORDER — ACETAMINOPHEN 325 MG PO TABS
650.0000 mg | ORAL_TABLET | ORAL | Status: DC | PRN
  Administered 2014-07-05: 650 mg via ORAL
  Filled 2014-07-04: qty 2

## 2014-07-04 MED ORDER — ONDANSETRON HCL 4 MG/2ML IJ SOLN
4.0000 mg | Freq: Four times a day (QID) | INTRAMUSCULAR | Status: DC | PRN
  Administered 2014-07-05: 4 mg via INTRAVENOUS
  Filled 2014-07-04: qty 2

## 2014-07-04 MED ORDER — CITRIC ACID-SODIUM CITRATE 334-500 MG/5ML PO SOLN
30.0000 mL | ORAL | Status: DC | PRN
  Administered 2014-07-05: 30 mL via ORAL
  Filled 2014-07-04: qty 15

## 2014-07-04 MED ORDER — OXYCODONE-ACETAMINOPHEN 5-325 MG PO TABS
2.0000 | ORAL_TABLET | ORAL | Status: DC | PRN
Start: 2014-07-04 — End: 2014-07-05

## 2014-07-04 MED ORDER — LIDOCAINE HCL (PF) 1 % IJ SOLN
30.0000 mL | INTRAMUSCULAR | Status: DC | PRN
  Filled 2014-07-04: qty 30

## 2014-07-04 MED ORDER — DIPHENHYDRAMINE HCL 50 MG/ML IJ SOLN: 12.5000 mg | INTRAMUSCULAR | Status: DC | PRN

## 2014-07-04 NOTE — H&P (Signed)
Rachael Chandler is a 34 y.o. female presenting for srom at term. Maternal Medical History:  Reason for admission: Rupture of membranes.   Contractions: Onset was less than 1 hour ago.   Frequency: irregular.   Perceived severity is mild.    Fetal activity: Perceived fetal activity is normal.   Last perceived fetal movement was within the past hour.    Prenatal complications: no prenatal complications Prenatal Complications - Diabetes: none.    OB History    Gravida Para Term Preterm AB TAB SAB Ectopic Multiple Living   1              Past Medical History  Diagnosis Date  . Ulcer     clinical dx; no Endoscopy  . Kidney stones      x 1  h/o  . GERD (gastroesophageal reflux disease)   . Headache(784.0)     otc meds prn  . 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 Sciatica of left side     in past   Past Surgical History  Procedure Laterality Date  . Fractured arm  1986    left  . Wisdom tooth extraction    . Dilatation & currettage/hysteroscopy with resectocope  02/26/2012    Procedure: DILATATION & CURETTAGE/HYSTEROSCOPY WITH RESECTOCOPE;  Surgeon: Purcell Nails, MD;  Location: [facility];  Service: Gynecology;  Laterality: N/A;     . Cautherization     Family History: family history includes Alcohol abuse in her father, maternal grandmother, and mother; Diabetes in her maternal grandmother; Heart disease in her paternal grandmother; Hyperlipidemia in her maternal grandmother; Hypertension in her maternal grandmother; Renal Disease in her maternal grandmother; Stomach cancer in her mother; Sudden death in her mother. Social History:  reports that she has never smoked. She has never used smokeless tobacco. She reports that she does not drink alcohol or use illicit drugs.   Prenatal Transfer Tool  Maternal Diabetes: No Genetic Screening: Normal Maternal Ultrasounds/Referrals: Normal Fetal Ultrasounds or other Referrals:  None Maternal  Substance Abuse:  No Significant Maternal Medications:  None Significant Maternal Lab Results:  None Other Comments:  None  Review of Systems  All other systems reviewed and are negative.     Blood pressure 143/81, pulse 68, temperature 98 F (36.7 C), temperature source Oral, resp. rate 18, height  (1.575 m), weight 85.503 kg (188 lb 8 oz), last menstrual period 09/20/2013. Maternal Exam:  Uterine Assessment: Contraction strength is mild.  Contraction frequency is irregular.   Abdomen: Patient reports no abdominal tenderness. Fetal presentation: vertex  Introitus: Normal vulva. Normal vagina.  Pelvis: adequate for delivery.   Cervix: Cervix evaluated by digital exam.     Physical Exam  Vitals reviewed. Constitutional: She is oriented to person, place, and time. She appears well-developed and well-nourished.  HENT:  Head: Normocephalic and atraumatic.  Eyes: Pupils are equal, round, and reactive to light.  Neck: Normal range of motion. Neck supple.  Cardiovascular: Normal rate and regular rhythm.   Respiratory: Effort normal and breath sounds normal.  GI: Soft. Bowel sounds are normal.  Genitourinary: Vagina normal and uterus normal.  Musculoskeletal: Normal range of motion.  Neurological: She is alert and oriented to person, place, and time.  Skin: Skin is warm.  Psychiatric: She has a normal mood and affect.    Prenatal labs: ABO, Rh: --/--/O POS (03/14 2210) Antibody: NEG (03/14 2210) Rubella: Immune (08/03 0000) RPR: Nonreactive (08/03 0000)  HBsAg: Negative (08/03 0000)  HIV: Non-reactive (08/03 0000)  GBS: Negative (02/05 0000)   Assessment/Plan: SROM at term Gestational thrombocytopenia ADmit   Rachael Chandler J 07/04/2014, 11:27 PM

## 2014-07-04 NOTE — MAU Note (Signed)
PT SAYS SROM AT 2115-   CLEAR  FLUID.   PRIMARY -  DR Shella SpearingAVOON.   DENIES HSV AND MRSA.  GBS-  UNSURE.   VE IN OFFICE  2-3  CM.     UC - 3-4  MIN.

## 2014-07-05 ENCOUNTER — Inpatient Hospital Stay (HOSPITAL_COMMUNITY): Payer: Managed Care, Other (non HMO) | Admitting: Anesthesiology

## 2014-07-05 ENCOUNTER — Inpatient Hospital Stay (HOSPITAL_COMMUNITY): Admission: RE | Admit: 2014-07-05 | Payer: Managed Care, Other (non HMO) | Source: Ambulatory Visit

## 2014-07-05 ENCOUNTER — Encounter (HOSPITAL_COMMUNITY): Payer: Self-pay | Admitting: Anesthesiology

## 2014-07-05 ENCOUNTER — Encounter (HOSPITAL_COMMUNITY)
Admission: AD | Disposition: A | Discharge status: Home / Self Care | Payer: Self-pay | Source: Ambulatory Visit | Attending: Obstetrics and Gynecology

## 2014-07-05 LAB — CBC
HCT: 35 % — ABNORMAL LOW (ref 36.0–46.0)
Hemoglobin: 12.2 g/dL (ref 12.0–15.0)
MCH: 34.3 pg — AB (ref 26.0–34.0)
MCHC: 34.9 g/dL (ref 30.0–36.0)
MCV: 98.3 fL (ref 78.0–100.0)
Platelets: 83 10*3/uL — ABNORMAL LOW (ref 150–400)
RBC: 3.56 MIL/uL — ABNORMAL LOW (ref 3.87–5.11)
RDW: 13 % (ref 11.5–15.5)
WBC: 14.8 10*3/uL — ABNORMAL HIGH (ref 4.0–10.5)

## 2014-07-05 LAB — ABO/RH: ABO/RH(D): O POS

## 2014-07-05 LAB — RPR: RPR Ser Ql: NONREACTIVE

## 2014-07-05 SURGERY — Surgical Case
Anesthesia: Epidural

## 2014-07-05 MED ORDER — MORPHINE SULFATE (PF) 0.5 MG/ML IJ SOLN
INTRAMUSCULAR | Status: DC | PRN
  Administered 2014-07-05: 3 mg via EPIDURAL

## 2014-07-05 MED ORDER — WITCH HAZEL-GLYCERIN EX PADS: 1.0000 "application " | MEDICATED_PAD | CUTANEOUS | Status: DC | PRN

## 2014-07-05 MED ORDER — BUPIVACAINE HCL (PF) 0.25 % IJ SOLN
INTRAMUSCULAR | Status: DC | PRN
  Administered 2014-07-05: 10 mL

## 2014-07-05 MED ORDER — ONDANSETRON HCL 4 MG/2ML IJ SOLN
INTRAMUSCULAR | Status: DC | PRN
  Administered 2014-07-05 (×2): 4 mg via INTRAVENOUS

## 2014-07-05 MED ORDER — ONDANSETRON HCL 4 MG/2ML IJ SOLN: 4.0000 mg | INTRAMUSCULAR | Status: DC | PRN

## 2014-07-05 MED ORDER — OXYTOCIN 40 UNITS IN LACTATED RINGERS INFUSION - SIMPLE MED: 62.5000 mL/h | INTRAVENOUS | Status: AC

## 2014-07-05 MED ORDER — DIPHENHYDRAMINE HCL 25 MG PO CAPS: 25.0000 mg | ORAL_CAPSULE | ORAL | Status: DC | PRN

## 2014-07-05 MED ORDER — NALOXONE HCL 1 MG/ML IJ SOLN: 1.0000 ug/kg/h | INTRAVENOUS | Status: DC | PRN

## 2014-07-05 MED ORDER — SCOPOLAMINE 1 MG/3DAYS TD PT72
1.0000 | MEDICATED_PATCH | TRANSDERMAL | Status: DC
  Administered 2014-07-05: 1.5 mg via TRANSDERMAL

## 2014-07-05 MED ORDER — OXYCODONE-ACETAMINOPHEN 5-325 MG PO TABS
2.0000 | ORAL_TABLET | ORAL | Status: DC | PRN
  Administered 2014-07-06 – 2014-07-08 (×8): 2 via ORAL
  Filled 2014-07-05 (×8): qty 2

## 2014-07-05 MED ORDER — BUPIVACAINE LIPOSOME 1.3 % IJ SUSP
20.0000 mL | Freq: Once | INTRAMUSCULAR | Status: DC
  Filled 2014-07-05: qty 20

## 2014-07-05 MED ORDER — LIDOCAINE-EPINEPHRINE (PF) 2 %-1:200000 IJ SOLN
INTRAMUSCULAR | Status: AC
  Filled 2014-07-05: qty 20

## 2014-07-05 MED ORDER — MEPERIDINE HCL 25 MG/ML IJ SOLN: 6.2500 mg | INTRAMUSCULAR | Status: DC | PRN

## 2014-07-05 MED ORDER — NALBUPHINE HCL 10 MG/ML IJ SOLN: 5.0000 mg | Freq: Once | INTRAMUSCULAR | Status: AC | PRN

## 2014-07-05 MED ORDER — SIMETHICONE 80 MG PO CHEW
80.0000 mg | CHEWABLE_TABLET | Freq: Three times a day (TID) | ORAL | Status: DC
  Administered 2014-07-06 – 2014-07-08 (×6): 80 mg via ORAL
  Filled 2014-07-05 (×7): qty 1

## 2014-07-05 MED ORDER — PRENATAL MULTIVITAMIN CH
1.0000 | ORAL_TABLET | Freq: Every day | ORAL | Status: DC
  Filled 2014-07-05 (×2): qty 1

## 2014-07-05 MED ORDER — OXYCODONE-ACETAMINOPHEN 5-325 MG PO TABS
1.0000 | ORAL_TABLET | ORAL | Status: DC | PRN
  Administered 2014-07-06 – 2014-07-07 (×5): 1 via ORAL
  Filled 2014-07-05 (×6): qty 1

## 2014-07-05 MED ORDER — ACETAMINOPHEN 160 MG/5ML PO SOLN
975.0000 mg | Freq: Once | ORAL | Status: AC
  Administered 2014-07-05: 975 mg via ORAL

## 2014-07-05 MED ORDER — DEXAMETHASONE SODIUM PHOSPHATE 10 MG/ML IJ SOLN
INTRAMUSCULAR | Status: DC | PRN
  Administered 2014-07-05: 4 mg via INTRAVENOUS

## 2014-07-05 MED ORDER — DIPHENHYDRAMINE HCL 25 MG PO CAPS
25.0000 mg | ORAL_CAPSULE | Freq: Four times a day (QID) | ORAL | Status: DC | PRN
Start: 2014-07-05 — End: 2014-07-08

## 2014-07-05 MED ORDER — ZOLPIDEM TARTRATE 5 MG PO TABS: 5.0000 mg | ORAL_TABLET | Freq: Every evening | ORAL | Status: DC | PRN

## 2014-07-05 MED ORDER — METHYLERGONOVINE MALEATE 0.2 MG/ML IJ SOLN: 0.2000 mg | INTRAMUSCULAR | Status: DC | PRN

## 2014-07-05 MED ORDER — LACTATED RINGERS IV SOLN
INTRAVENOUS | Status: DC | PRN
  Administered 2014-07-05 (×2): via INTRAVENOUS

## 2014-07-05 MED ORDER — LACTATED RINGERS IV SOLN
INTRAVENOUS | Status: DC | PRN
  Administered 2014-07-05: 13:00:00 via INTRAVENOUS

## 2014-07-05 MED ORDER — DEXAMETHASONE SODIUM PHOSPHATE 4 MG/ML IJ SOLN
INTRAMUSCULAR | Status: AC
  Filled 2014-07-05: qty 1

## 2014-07-05 MED ORDER — BUPIVACAINE HCL (PF) 0.25 % IJ SOLN
INTRAMUSCULAR | Status: AC
  Filled 2014-07-05: qty 10

## 2014-07-05 MED ORDER — NALBUPHINE HCL 10 MG/ML IJ SOLN
5.0000 mg | INTRAMUSCULAR | Status: DC | PRN
Start: 2014-07-05 — End: 2014-07-08

## 2014-07-05 MED ORDER — 0.9 % SODIUM CHLORIDE (POUR BTL) OPTIME
TOPICAL | Status: DC | PRN
  Administered 2014-07-05: 1000 mL

## 2014-07-05 MED ORDER — MENTHOL 3 MG MT LOZG: 1.0000 | LOZENGE | OROMUCOSAL | Status: DC | PRN

## 2014-07-05 MED ORDER — LIDOCAINE HCL (PF) 1 % IJ SOLN
INTRAMUSCULAR | Status: DC | PRN
  Administered 2014-07-05 (×2): 4 mL

## 2014-07-05 MED ORDER — SODIUM BICARBONATE 8.4 % IV SOLN
INTRAVENOUS | Status: AC
  Filled 2014-07-05: qty 50

## 2014-07-05 MED ORDER — SCOPOLAMINE 1 MG/3DAYS TD PT72: 1.0000 | MEDICATED_PATCH | Freq: Once | TRANSDERMAL | Status: DC

## 2014-07-05 MED ORDER — ONDANSETRON HCL 4 MG/2ML IJ SOLN
INTRAMUSCULAR | Status: AC
  Filled 2014-07-05: qty 2

## 2014-07-05 MED ORDER — ONDANSETRON HCL 4 MG/2ML IJ SOLN: 4.0000 mg | Freq: Three times a day (TID) | INTRAMUSCULAR | Status: DC | PRN

## 2014-07-05 MED ORDER — LACTATED RINGERS IV SOLN
INTRAVENOUS | Status: DC
  Administered 2014-07-05: 21:00:00 via INTRAVENOUS

## 2014-07-05 MED ORDER — LIDOCAINE-EPINEPHRINE (PF) 2 %-1:200000 IJ SOLN
INTRAMUSCULAR | Status: DC | PRN
  Administered 2014-07-05: 3 mL via EPIDURAL
  Administered 2014-07-05: 2 mL via EPIDURAL
  Administered 2014-07-05 (×2): 3 mL via EPIDURAL
  Administered 2014-07-05: 2 mL via EPIDURAL
  Administered 2014-07-05: 5 mL via EPIDURAL

## 2014-07-05 MED ORDER — SIMETHICONE 80 MG PO CHEW: 80.0000 mg | CHEWABLE_TABLET | ORAL | Status: DC | PRN

## 2014-07-05 MED ORDER — SCOPOLAMINE 1 MG/3DAYS TD PT72
MEDICATED_PATCH | TRANSDERMAL | Status: AC
  Filled 2014-07-05: qty 1

## 2014-07-05 MED ORDER — NALOXONE HCL 0.4 MG/ML IJ SOLN: 0.4000 mg | INTRAMUSCULAR | Status: DC | PRN

## 2014-07-05 MED ORDER — SODIUM CHLORIDE 0.9 % IJ SOLN: 3.0000 mL | INTRAMUSCULAR | Status: DC | PRN

## 2014-07-05 MED ORDER — FENTANYL 2.5 MCG/ML BUPIVACAINE 1/10 % EPIDURAL INFUSION (WH - ANES)
INTRAMUSCULAR | Status: DC | PRN
  Administered 2014-07-05: 14 mL/h via EPIDURAL

## 2014-07-05 MED ORDER — METHYLERGONOVINE MALEATE 0.2 MG PO TABS: 0.2000 mg | ORAL_TABLET | ORAL | Status: DC | PRN

## 2014-07-05 MED ORDER — NALBUPHINE HCL 10 MG/ML IJ SOLN: 5.0000 mg | INTRAMUSCULAR | Status: DC | PRN

## 2014-07-05 MED ORDER — LANOLIN HYDROUS EX OINT: 1.0000 "application " | TOPICAL_OINTMENT | CUTANEOUS | Status: DC | PRN

## 2014-07-05 MED ORDER — CEFAZOLIN SODIUM-DEXTROSE 2-3 GM-% IV SOLR
INTRAVENOUS | Status: DC | PRN
  Administered 2014-07-05: 2 g via INTRAVENOUS

## 2014-07-05 MED ORDER — MEPERIDINE HCL 25 MG/ML IJ SOLN
INTRAMUSCULAR | Status: DC | PRN
  Administered 2014-07-05 (×2): 12.5 mg via INTRAVENOUS

## 2014-07-05 MED ORDER — SENNOSIDES-DOCUSATE SODIUM 8.6-50 MG PO TABS
2.0000 | ORAL_TABLET | ORAL | Status: DC
  Administered 2014-07-06 – 2014-07-07 (×3): 2 via ORAL
  Filled 2014-07-05 (×3): qty 2

## 2014-07-05 MED ORDER — OXYTOCIN 10 UNIT/ML IJ SOLN
INTRAMUSCULAR | Status: AC
  Filled 2014-07-05: qty 4

## 2014-07-05 MED ORDER — DIPHENHYDRAMINE HCL 50 MG/ML IJ SOLN: 12.5000 mg | INTRAMUSCULAR | Status: DC | PRN

## 2014-07-05 MED ORDER — ACETAMINOPHEN 160 MG/5ML PO SOLN
ORAL | Status: AC
  Filled 2014-07-05: qty 40.6

## 2014-07-05 MED ORDER — MORPHINE SULFATE 0.5 MG/ML IJ SOLN
INTRAMUSCULAR | Status: AC
  Filled 2014-07-05: qty 10

## 2014-07-05 MED ORDER — IBUPROFEN 600 MG PO TABS
600.0000 mg | ORAL_TABLET | Freq: Four times a day (QID) | ORAL | Status: DC
  Filled 2014-07-05: qty 1

## 2014-07-05 MED ORDER — DIBUCAINE 1 % RE OINT: 1.0000 "application " | TOPICAL_OINTMENT | RECTAL | Status: DC | PRN

## 2014-07-05 MED ORDER — BUPIVACAINE LIPOSOME 1.3 % IJ SUSP
INTRAMUSCULAR | Status: DC | PRN
  Administered 2014-07-05: 20 mL

## 2014-07-05 MED ORDER — ONDANSETRON HCL 4 MG PO TABS: 4.0000 mg | ORAL_TABLET | ORAL | Status: DC | PRN

## 2014-07-05 MED ORDER — MEPERIDINE HCL 25 MG/ML IJ SOLN
INTRAMUSCULAR | Status: AC
  Filled 2014-07-05: qty 1

## 2014-07-05 MED ORDER — HYDROMORPHONE HCL 1 MG/ML IJ SOLN: 0.2500 mg | INTRAMUSCULAR | Status: DC | PRN

## 2014-07-05 MED ORDER — OXYTOCIN 10 UNIT/ML IJ SOLN
40.0000 [IU] | INTRAVENOUS | Status: DC | PRN
  Administered 2014-07-05: 40 [IU] via INTRAVENOUS

## 2014-07-05 MED ORDER — SIMETHICONE 80 MG PO CHEW
80.0000 mg | CHEWABLE_TABLET | ORAL | Status: DC
  Administered 2014-07-06 – 2014-07-07 (×3): 80 mg via ORAL
  Filled 2014-07-05 (×3): qty 1

## 2014-07-05 MED ORDER — TETANUS-DIPHTH-ACELL PERTUSSIS 5-2.5-18.5 LF-MCG/0.5 IM SUSP
0.5000 mL | Freq: Once | INTRAMUSCULAR | Status: DC
  Filled 2014-07-05: qty 0.5

## 2014-07-05 SURGICAL SUPPLY — 34 items
CLAMP CORD UMBIL (MISCELLANEOUS) IMPLANT
CLOTH BEACON ORANGE TIMEOUT ST (SAFETY) ×3 IMPLANT
CONTAINER PREFILL 10% NBF 15ML (MISCELLANEOUS) IMPLANT
DERMABOND ADHESIVE PROPEN (GAUZE/BANDAGES/DRESSINGS) ×2
DERMABOND ADVANCED .7 DNX6 (GAUZE/BANDAGES/DRESSINGS) ×1 IMPLANT
DRAPE SHEET LG 3/4 BI-LAMINATE (DRAPES) IMPLANT
DRSG OPSITE POSTOP 4X10 (GAUZE/BANDAGES/DRESSINGS) ×3 IMPLANT
DURAPREP 26ML APPLICATOR (WOUND CARE) ×3 IMPLANT
ELECT REM PT RETURN 9FT ADLT (ELECTROSURGICAL) ×3
ELECTRODE REM PT RTRN 9FT ADLT (ELECTROSURGICAL) ×1 IMPLANT
EXTRACTOR VACUUM M CUP 4 TUBE (SUCTIONS) IMPLANT
EXTRACTOR VACUUM M CUP 4' TUBE (SUCTIONS)
GLOVE BIO SURGEON STRL SZ7.5 (GLOVE) ×3 IMPLANT
GOWN STRL REUS W/TWL LRG LVL3 (GOWN DISPOSABLE) ×6 IMPLANT
KIT ABG SYR 3ML LUER SLIP (SYRINGE) IMPLANT
NEEDLE HYPO 22GX1.5 SAFETY (NEEDLE) ×3 IMPLANT
NEEDLE HYPO 25X5/8 SAFETYGLIDE (NEEDLE) IMPLANT
NEEDLE SPNL 20GX3.5 QUINCKE YW (NEEDLE) IMPLANT
NS IRRIG 1000ML POUR BTL (IV SOLUTION) ×6 IMPLANT
PACK C SECTION WH (CUSTOM PROCEDURE TRAY) ×3 IMPLANT
STAPLER VISISTAT 35W (STAPLE) IMPLANT
SUT MNCRL 0 VIOLET CTX 36 (SUTURE) ×4 IMPLANT
SUT MNCRL AB 3-0 PS2 27 (SUTURE) ×3 IMPLANT
SUT MON AB 2-0 CT1 27 (SUTURE) ×3 IMPLANT
SUT MON AB-0 CT1 36 (SUTURE) ×6 IMPLANT
SUT MONOCRYL 0 CTX 36 (SUTURE) ×8
SUT PLAIN 0 NONE (SUTURE) IMPLANT
SUT PLAIN 2 0 (SUTURE)
SUT PLAIN 2 0 XLH (SUTURE) ×3 IMPLANT
SUT PLAIN ABS 2-0 CT1 27XMFL (SUTURE) IMPLANT
SYR 20CC LL (SYRINGE) IMPLANT
SYR CONTROL 10ML LL (SYRINGE) ×3 IMPLANT
TOWEL OR 17X24 6PK STRL BLUE (TOWEL DISPOSABLE) ×3 IMPLANT
TRAY FOLEY CATH 14FR (SET/KITS/TRAYS/PACK) ×3 IMPLANT

## 2014-07-05 NOTE — Progress Notes (Signed)
   07/05/14 1500  Clinical Encounter Type  Visited With Patient and family together (husband Rachael Chandler)  Visit Type (code apgar)  Referral From (safety rounds/code apgar call)  Spiritual Encounters  Spiritual Needs Emotional  Stress Factors  Patient Stress Factors Loss of control  Family Stress Factors Loss of control   Responded to Code Apgar to meet family, provide pastoral presence, and introduce Spiritual Care as part of support team.  Spoke with pt, who goes by Rachael Chandler, and husband Rachael Chandler in c-section recovery.  They were frightened by about baby's unexpected event, as well as feeling some stress from unscheduled surgery, but they felt some relief at hearing encouraging reports from Dr Rachael Chandler and seeing their baby prior to his time in central nursery.  [facility] Chandler[facility], and he was planning to see her after taking baby to nursery for observation.    Couple may benefit from reassurance from providers, particularly related to possible feelings of guilt, as Rachael Chandler was already asking, "Was it my fault? Was it something I did?"  Rachael Chandler will follow, but please page 24/7 as needs arise.  Thank you.  7[address] Sunset DriveChaplain Rachael Chandler, [place] DakotaMDiv [phone]

## 2014-07-05 NOTE — Progress Notes (Signed)
Called CRNA,Janet Sabino GasserMullins to pts room to assess serosanguineous drainage from pts epidural site. RN was reassured that amt and color of drainage was WNL. Epidural cath needs to stay in til morning CBC results.

## 2014-07-05 NOTE — Progress Notes (Signed)
Rachael BradleyDanielle Chandler is a 34 y.o. G1P0 at 8874w5d by LMP admitted for active labor, rupture of membranes  Subjective: Starting to feel some pressure  Objective: BP 114/81 mmHg  Pulse 92  Temp(Src) 98.3 F (36.8 C) (Oral)  Resp 18  Ht 5\' 2"  (1.575 m)  Wt 85.503 kg (188 lb 8 oz)  BMI 34.47 kg/m2  SpO2 96%  LMP 09/20/2013      FHT:  FHR: 145 bpm, variability: moderate,  accelerations:  Present,  decelerations:  Absent UC:   regular, every 3 minutes SVE:   10/100/+2/LOT  Labs: Lab Results  Component Value Date   WBC 10.5 07/04/2014   HGB 13.4 07/04/2014   HCT 37.4 07/04/2014   MCV 97.7 07/04/2014   PLT 116* 07/04/2014    Assessment / Plan: Protracted active phase  Labor: continue augmentation Preeclampsia:  no signs or symptoms of toxicity Fetal Wellbeing:  Category I Pain Control:  Epidural I/D:  n/a Anticipated MOD:  NSVD  Rachael Chandler J 07/05/2014, 8:04 AM

## 2014-07-05 NOTE — Anesthesia Preprocedure Evaluation (Signed)
Anesthesia Evaluation  Patient identified by MRN, date of birth, ID band Patient awake    Reviewed: Allergy & Precautions, NPO status , Patient's Chart, lab work & pertinent test results  History of Anesthesia Complications Negative for: history of anesthetic complications  Airway Mallampati: II  TM Distance: >3 FB Neck ROM: Full    Dental no notable dental hx. 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, just prior to epidural placement was 113 and will obtain a count prior to discontinuation of catheter   Anesthesia Other Findings   Reproductive/Obstetrics (+) Pregnancy                             Anesthesia Physical Anesthesia Plan  ASA: II  Anesthesia Plan: Epidural   Post-op Pain Management:    Induction:   Airway Management Planned:   Additional Equipment:   Intra-op Plan:   Post-operative Plan:   Informed Consent: I have reviewed the patients History and Physical, chart, labs and discussed the procedure including the risks, benefits and alternatives for the proposed anesthesia with the patient or authorized representative who has indicated his/her understanding and acceptance.   Dental advisory given  Plan Discussed with:   Anesthesia Plan Comments:         Anesthesia Quick Evaluation

## 2014-07-05 NOTE — Op Note (Signed)
Cesarean Section Procedure Note  Indications: failure to progress: arrest of descent  Pre-operative Diagnosis: 40 week 5 day pregnancy.  Post-operative Diagnosis: same  Surgeon: Lenoard AdenAAVON,Amily Depp J   Assistants: Arita Missawson, CNM  Anesthesia: Epidural anesthesia and Local anesthesia marcaine and exparel  ASA Class: 2  Procedure Details  The patient was seen in the Holding Room. The risks, benefits, complications, treatment options, and expected outcomes were discussed with the patient.  The patient concurred with the proposed plan, giving informed consent. The risks of anesthesia, infection, bleeding and possible injury to other organs discussed. Injury to bowel, bladder, or ureter with possible need for repair discussed. Possible need for transfusion with secondary risks of hepatitis or HIV acquisition discussed. Post operative complications to include but not limited to DVT, PE and Pneumonia noted. The site of surgery properly noted/marked. The patient was taken to Operating Room # 9, identified as [name], [name] and the procedure verified as C-Section Delivery. A Time Out was held and the above information confirmed.  After induction of anesthesia, the patient was draped and prepped in the usual sterile manner. A Pfannenstiel incision was made and carried down through the subcutaneous tissue to the fascia. Fascial incision was made and extended transversely using Mayo scissors. The fascia was separated from the underlying rectus tissue superiorly and inferiorly. The peritoneum was identified and entered. Peritoneal incision was extended longitudinally. The utero-vesical peritoneal reflection was incised transversely and the bladder flap was bluntly freed from the lower uterine segment. A low transverse uterine incision(Kerr hysterotomy) was made. Delivered from OP presentation was a  female with Apgar scores of 9 at one minute and 9 at five minutes.  Assistance provided by RN with vaginal elevation of  fetal vertex.Bulb suctioning gently performed. Neonatal team in attendance.After the umbilical cord was clamped and cut cord blood was obtained for evaluation. The placenta was removed intact and appeared normal. The uterus was curetted with a dry lap pack. Good hemostasis was noted.The uterine outline, tubes and ovaries appeared normal. The uterine incision was closed with running locked sutures of 0 Monocryl x 2 layers. Left cervical extension closed with O Monocryl. Hemostasis was observed. Lavage was carried out until clear.The parietal peritoneum was closed with a running 2-0 Monocryl suture. The fascia was then reapproximated with running sutures of 0 Monocryl. The skin was reapproximated with 3-0 monocryl after Radar Base closure with 3-0 plain.  Instrument, sponge, and needle counts were correct prior the abdominal closure and at the conclusion of the case.   Findings: OP, female, left cervical extension  Estimated Blood Loss:  500         Drains: foley                 Specimens: placenta                 Complications:  None; patient tolerated the procedure well.         Disposition: PACU - hemodynamically stable.         Condition: stable  Attending Attestation: I performed the procedure.

## 2014-07-05 NOTE — Anesthesia Procedure Notes (Signed)
Epidural Patient location during procedure: OB Start time: 07/05/2014 12:05 AM  Staffing Anesthesiologist: Karie SchwalbeJUDD, Arantxa Piercey Performed by: anesthesiologist   Preanesthetic Checklist Completed: patient identified, site marked, surgical consent, pre-op evaluation, timeout performed, IV checked, risks and benefits discussed and monitors and equipment checked  Epidural Patient position: sitting Prep: site prepped and draped and DuraPrep Patient monitoring: continuous pulse ox and blood pressure Approach: midline Location: L3-L4 Injection technique: LOR saline  Needle:  Needle type: Tuohy  Needle gauge: 17 G Needle length: 9 cm and 9 Needle insertion depth: 5 cm cm Catheter type: closed end flexible Catheter size: 19 Gauge Catheter at skin depth: 10 cm Test dose: negative  Assessment Events: blood not aspirated, injection not painful, no injection resistance, negative IV test and no paresthesia  Additional Notes Patient identified. Risks/Benefits/Options discussed with patient including but not limited to bleeding, infection, nerve damage, paralysis, failed block, incomplete pain control, headache, blood pressure changes, nausea, vomiting, reactions to medication both or allergic, itching and postpartum back pain. Confirmed with bedside nurse the patient's most recent platelet count. Confirmed with patient that they are not currently taking any anticoagulation, have any bleeding history or any family history of bleeding disorders. Patient expressed understanding and wished to proceed. All questions were answered. Sterile technique was used throughout the entire procedure. Please see nursing notes for vital signs. Test dose was given through epidural catheter and negative prior to continuing to dose epidural or start infusion. Warning signs of high block given to the patient including shortness of breath, tingling/numbness in hands, complete motor block, or any concerning symptoms with  instructions to call for help. Patient was given instructions on fall risk and not to get out of bed. All questions and concerns addressed with instructions to call with any issues or inadequate analgesia.    Patient with ITP, platelets consistently in low 100s, current count >100. Single pass easily placed epidural. Will obtain a platelet count prior to removal of catheter.

## 2014-07-05 NOTE — Transfer of Care (Signed)
Immediate Anesthesia Transfer of Care Note  Patient: Rachael Chandler  Procedure(s) Performed: Procedure(s): CESAREAN SECTION (N/A)  Patient Location: PACU  Anesthesia Type:Epidural  Level of Consciousness: awake, alert , oriented and patient cooperative  Airway & Oxygen Therapy: Patient Spontanous Breathing  Post-op Assessment: Report given to RN and Post -op Vital signs reviewed and stable  Post vital signs: Reviewed and stable  Last Vitals:  Filed Vitals:   07/05/14 1355  BP:   Pulse:   Temp: 36.9 C  Resp:     Complications: No apparent anesthesia complications

## 2014-07-05 NOTE — Progress Notes (Signed)
Rachael BradleyDanielle Chandler is a 34 y.o. G1P0 at 5330w5d by LMP admitted for active labor, rupture of membranes  Subjective: comfortable  Objective: BP 113/73 mmHg  Pulse 82  Temp(Src) 98.3 F (36.8 C) (Oral)  Resp 18  Ht 5\' 2"  (1.575 m)  Wt 85.503 kg (188 lb 8 oz)  BMI 34.47 kg/m2  SpO2 96%  LMP 09/20/2013      FHT:  FHR: 150 bpm, variability: moderate,  accelerations:  Present,  decelerations:  Absent UC:   irregular, every 1-4 minutes SVE:   Dilation: 10 Effacement (%): 100 Station: +1 Exam by:: Rachael Nay. Byers, RN  Labs: Lab Results  Component Value Date   WBC 10.5 07/04/2014   HGB 13.4 07/04/2014   HCT 37.4 07/04/2014   MCV 97.7 07/04/2014   PLT 116* 07/04/2014    Assessment / Plan: Protracted active phase  Labor: limited passive descent x 2 hrs Preeclampsia:  no signs or symptoms of toxicity Fetal Wellbeing:  Category I Pain Control:  Epidural I/D:  n/a Anticipated MOD:  Cautious approach to VD. Will augment at this time.  Katha Kuehne J 07/05/2014, 6:47 AM

## 2014-07-05 NOTE — Progress Notes (Signed)
Rachael BradleyDanielle Chandler is a 34 y.o. G1P0 at 6536w5d by LMP admitted for active labor, rupture of membranes  Subjective: Feels pressure. Pushing at least 3 hrs  Objective: BP 133/77 mmHg  Pulse 72  Temp(Src) 99 F (37.2 C) (Oral)  Resp 18  Ht 5\' 2"  (1.575 m)  Wt 85.503 kg (188 lb 8 oz)  BMI 34.47 kg/m2  SpO2 96%  LMP 09/20/2013      FHT:  FHR: 155 bpm, variability: moderate,  accelerations:  Present,  decelerations:  Present occ late, occ variable UC:   regular, every 2 minutes SVE:   10/100/+2 Significant molding of fetal vertex, no descent  Labs: Lab Results  Component Value Date   WBC 10.5 07/04/2014   HGB 13.4 07/04/2014   HCT 37.4 07/04/2014   MCV 97.7 07/04/2014   PLT 116* 07/04/2014    Assessment / Plan: Arrest of decent  Labor: abnormal descent of vertex Preeclampsia:  no signs or symptoms of toxicity Fetal Wellbeing:  Category I Pain Control:  Epidural I/D:  n/a Anticipated MOD:  Proceed with cesearean section. Risks vs benefits discussed. Consent done. Pt will proceed.   Rachael Chandler J 07/05/2014, 12:37 PM

## 2014-07-05 NOTE — Lactation Note (Signed)
This note was copied from the chart of Rachael Chandler. Lactation Consultation Note  Patient Name: Rachael Chandler NWGNF'AToday's Date: 07/05/2014 Reason for consult: Initial assessment;Difficult latch;Other (Comment) (baby with hx of "code apgar" after delivery while feeding) This is mom's first baby and baby had an episode of turning blue, requiring some resusitative efforts but was recently spoon fed colostrum, with help of RN, Claudina LickMary Fitch and he is pink with firm muscle tone and cuing to feed now.  LC assisted him to latch on (R) breast in football position.  LC demonstrated hand expression and drops obtained prior to latch.  Mom says her nipples usually are more everted but she noted them becoming flatter toward end of pregnancy.  She has large/soft/compressible breasts and with sandwiching of breast tissue, baby able to latch and achieve deep grasp with rhythmical sucking bursts and intermittent swallows for 10 minutes.  Mom reports that he opened mouth wide for his first feeding.  FOB at bedside and able to assist with breast compression, as well.  Baby remained pink throughout feeding, came off and was no longer cuing once he was swaddled and held by FOB.  LC provided shells for use when mom begins wearing a bra, a hand pump for brief pre-pumping and also recommends some reverse pressure of breast tissue to reduce swelling around nipple and areola.  RN, Corrie DandyMary arrived in room after feeding and is aware of assessment and supplies provided.  LC encouraged frequent STS and cue feedings.  Mom encouraged to feed baby 8-12 times/24 hours and with feeding cues. LC encouraged review of Baby and Me pp 9, 14 and 20-25 for STS and BF information. LC provided [employer] Resource brochure and reviewed [facility] services and list of community and web site resources.  The base of mom's nipple may need the larger flange for her hand pump (#27 given) and mom has a personal Medela pump and purchased #27 flanges for that pump, as well  prior to delivery.  #20 and #24 nipple shields and a handout with both written and verbal information about why they may be needed reviewed with parents.  However, if baby latches well without NS, that is recommended.   Maternal Data Formula Feeding for Exclusion: No Has patient been taught Hand Expression?: Yes (RN, [facility]) Does the patient have breastfeeding experience prior to this delivery?: No  Feeding Feeding Type: Breast Fed Length of feed: 10 min  LATCH Score/Interventions Latch: Grasps breast easily, tongue down, lips flanged, rhythmical sucking. (football position on (R);  breast compression)  Audible Swallowing: Spontaneous and intermittent Intervention(s): Skin to skin;Hand expression  Type of Nipple: Everted at rest and after stimulation (slightly evert and breast compressible) Intervention(s): Shells;Hand pump;Reverse pressure  Comfort (Breast/Nipple): Soft / non-tender     Hold (Positioning): Assistance needed to correctly position infant at breast and maintain latch. Intervention(s): Breastfeeding basics reviewed;Support Pillows;Position options;Skin to skin (recommend football or cross-cradle due to mom's large breasts)  LATCH Score: 9  (LC assisted and observed)  Lactation Tools Discussed/Used Tools: Shells;Pump;Nipple Dorris CarnesShields;Flanges Nipple shield size: 20;24;Other (comment) (placed in room for possible use at a later time) Flange Size: 27 Shell Type: Inverted Initiated by:: LC Date initiated:: 07/06/14 STS, cue feedings, hand expression Signs of proper latch and milk transfer How to compress breast in front of tip of baby's nose (slightly) if concerned that he is not able to breathe  Consult Status Consult Status: Follow-up Date: 07/06/14 Follow-up type: In-patient    Warrick ParisianBryant, Rachael Chandler  [place] 07/05/2014, 9:21 PM

## 2014-07-05 NOTE — Anesthesia Postprocedure Evaluation (Signed)
  Anesthesia Post-op Note  Patient: Rachael Chandler  Procedure(s) Performed: Procedure(s): CESAREAN SECTION (N/A)   Patient is awake, responsive, moving her legs, and has signs of resolution of her numbness. Platelets are low, we'll repeat tomorrow. Pain and nausea are reasonably well controlled. Vital signs are stable and clinically acceptable. Oxygen saturation is clinically acceptable. There are no apparent anesthetic complications at this time. Patient is ready for discharge.

## 2014-07-06 LAB — BIRTH TISSUE RECOVERY COLLECTION (PLACENTA DONATION)

## 2014-07-06 LAB — CBC
HEMATOCRIT: 28.6 % — AB (ref 36.0–46.0)
HEMOGLOBIN: 9.9 g/dL — AB (ref 12.0–15.0)
MCH: 34.3 pg — ABNORMAL HIGH (ref 26.0–34.0)
MCHC: 34.6 g/dL (ref 30.0–36.0)
MCV: 99 fL (ref 78.0–100.0)
Platelets: 80 10*3/uL — ABNORMAL LOW (ref 150–400)
RBC: 2.89 MIL/uL — ABNORMAL LOW (ref 3.87–5.11)
RDW: 13 % (ref 11.5–15.5)
WBC: 12.4 10*3/uL — ABNORMAL HIGH (ref 4.0–10.5)

## 2014-07-06 MED ORDER — MAGNESIUM OXIDE 400 (241.3 MG) MG PO TABS
200.0000 mg | ORAL_TABLET | Freq: Every day | ORAL | Status: DC
  Administered 2014-07-06 – 2014-07-08 (×3): 200 mg via ORAL
  Filled 2014-07-06 (×3): qty 0.5

## 2014-07-06 MED ORDER — POLYSACCHARIDE IRON COMPLEX 150 MG PO CAPS
150.0000 mg | ORAL_CAPSULE | Freq: Two times a day (BID) | ORAL | Status: DC
  Administered 2014-07-06 – 2014-07-08 (×5): 150 mg via ORAL
  Filled 2014-07-06 (×5): qty 1

## 2014-07-06 NOTE — Plan of Care (Signed)
Problem: Discharge Progression Outcomes Goal: Barriers To Progression Addressed/Resolved Outcome: Progressing Low platelet count

## 2014-07-06 NOTE — Progress Notes (Signed)
Encouraged to ambulate & do peri care with RN assist- pt refusing @ this time. Continues to lie in bed. Denies pain/ discomfort. "Sleepy"- enc to TCDB to increase O2 SATs >90%, while in bed. Inc spir. encouraged also.O2 remains on while asleep @ 2 L/min -

## 2014-07-06 NOTE — Progress Notes (Signed)
Encouraged pt to get up OOB for peri care with assist- pt refused @ this time.

## 2014-07-06 NOTE — Progress Notes (Signed)
POD # 1  Subjective: Pt reports feeling well, little sore with standing/ Pain controlled with long acting narcotic Tolerating po/ Foley d/c'd and due to void/ No n/v/ Flatus present No dizziness or SOB Activity: ad lib Bleeding is light Newborn info:  Information for the patient's newborn:  [place], Boy Tayja [[id number]][[id number]]  female   Circumcision: planning/ Feeding: breast   Objective:  VS:  Filed Vitals:   07/06/14 0300 07/06/14 0400 07/06/14 0500 07/06/14 0613  BP:    128/50  Pulse:    63  Temp:    98.3 F (36.8 C)  TempSrc:    Oral  Resp:      Height:      Weight:      SpO2: 93% 94% 95% 95%     I&O: Intake/Output      03/15 0701 - 03/16 0700 03/16 0701 - 03/17 0700   P.O. 1170    I.V. (mL/kg) 3787.5 (44.3)    Total Intake(mL/kg) 4957.5 (58)    Urine (mL/kg/hr) 3475 (1.7)    Blood 1000 (0.5)    Total Output 4475     Net +482.5          Urine Occurrence 0 x       Recent Labs  07/05/14 1418 07/06/14 0600  WBC 14.8* 12.4*  HGB 12.2 9.9*  HCT 35.0* 28.6*  PLT 83* 80*    Blood type: --/--/O POS, O POS (03/14 2210) Rubella: Immune (08/03 0000)    Physical Exam:  General: alert and cooperative CV: Regular rate and rhythm Resp: CTA bilaterally Abdomen: soft, nontender, bowel sounds hypoactive Incision: healing well, no drainage, no erythema, no hernia, no seroma, no swelling, well approximated with suture, honeycomb dsg c/d/i Uterine Fundus: firm, below umbilicus, nontender Lochia: minimal Ext: edema 1+ BLE and Homans sign is negative, no sign of DVT    Assessment: POD # 1/ G1P1001/ S/P C/Section d/t arrest of descent  Thrombocytopenia of pregnancy-stable ABL anemia Doing well  Plan: Discontinue Ibuprofen Recheck platelets in am Start Niferex Ambulate tid in hall Continue routine post op orders Epidural management per anesthesia   Signed: Donette LarryBHAMBRI, Mallie Linnemann, N, MSN, CNM 07/06/2014, 9:35 AM

## 2014-07-06 NOTE — Anesthesia Postprocedure Evaluation (Signed)
Anesthesia Post Note  Patient: Rachael Chandler  Procedure(s) Performed: Procedure(s) (LRB): CESAREAN SECTION (N/A)  Anesthesia type: Epidural  Patient location: Mother/Baby  Post pain: Pain level controlled  Post assessment: Post-op Vital signs reviewed  Last Vitals:  Filed Vitals:   07/06/14 0613  BP: 128/50  Pulse: 63  Temp: 36.8 C  Resp:     Post vital signs: Reviewed  Level of consciousness: awake  Complications: No apparent anesthesia complications

## 2014-07-06 NOTE — Lactation Note (Signed)
This note was copied from the chart of Rachael Chandler. Lactation Consultation Note  Patient Name: Rachael Teola BradleyDanielle Clarin ONGEX'BToday's Date: 07/06/2014 Reason for consult: Follow-up assessment;Difficult latch Mom called for assist with latching baby. She is concerned baby not obtaining good depth. She has some bruising on her left aerola. Nipples are becoming tender. She has not been using the nipple shield. Mom using hand pump to pre-pump, nipples are flat becoming slightly erect with stimulation. Left nipple not compressible with aerola edema. Initiated #20 nipple shield and baby immediately latched without much assist. He demonstrated a good suckling pattern with colostrum visible after nursing on each breast. Use of nipple shield/cleaning reviewed. Encouraged to post pump 4-6 times per day due to nipple shield use. Mom is having FOB bring her DEBP from home. Advised baby should be at the breast 8-12 times in 24 hours and with feeding ques. Look for colostrum in the nipple shield with feedings. Cluster feeding discussed. Encouraged to call for assist as needed.   Maternal Data    Feeding Feeding Type: Breast Fed Length of feed: 30 min  LATCH Score/Interventions Latch: Grasps breast easily, tongue down, lips flanged, rhythmical sucking. (using #20 nipple shield)  Audible Swallowing: A few with stimulation  Type of Nipple: Flat Intervention(s): Shells;Hand pump  Comfort (Breast/Nipple): Filling, red/small blisters or bruises, mild/mod discomfort (bruising on left aerola)  Problem noted: Mild/Moderate discomfort Interventions (Mild/moderate discomfort): Hand expression;Hand massage (EBM)  Hold (Positioning): Assistance needed to correctly position infant at breast and maintain latch. Intervention(s): Breastfeeding basics reviewed;Support Pillows;Position options;Skin to skin  LATCH Score: 6  Lactation Tools Discussed/Used Tools: Shells;Nipple Dorris CarnesShields;Pump Nipple shield size: 20 Shell  Type: Inverted Breast pump type: Manual   Consult Status Consult Status: Follow-up Date: 07/07/14 Follow-up type: In-patient    Alfred LevinsGranger, Edelmiro Innocent Ann 07/06/2014, 2:03 PM

## 2014-07-06 NOTE — Addendum Note (Signed)
Addendum  created 07/06/14 0830 by Jhonnie GarnerBeth M Leona Pressly, CRNA   Modules edited: Notes Section   Notes Section:  File: [id number]

## 2014-07-07 ENCOUNTER — Encounter (HOSPITAL_COMMUNITY): Payer: Self-pay | Admitting: Anesthesiology

## 2014-07-07 ENCOUNTER — Encounter (HOSPITAL_COMMUNITY): Payer: Self-pay | Admitting: Obstetrics and Gynecology

## 2014-07-07 LAB — PLATELET COUNT: Platelets: 80 10*3/uL — ABNORMAL LOW (ref 150–400)

## 2014-07-07 MED ORDER — TETANUS-DIPHTH-ACELL PERTUSSIS 5-2.5-18.5 LF-MCG/0.5 IM SUSP
0.5000 mL | Freq: Once | INTRAMUSCULAR | Status: AC
  Administered 2014-07-07: 0.5 mL via INTRAMUSCULAR
  Filled 2014-07-07: qty 0.5

## 2014-07-07 NOTE — Lactation Note (Signed)
This note was copied from the chart of Boy Corretta Noto. Lactation Consultation Note  Patient Name: Boy Teola BradleyDanielle Nading JXBJY'NToday's Date: 07/07/2014 Reason for consult: Follow-up assessment;Difficult latch due to mom's nipples only everting slightly.  LC called to observe and assist with baby latching to breast.  He is alert and cuing and easily latches to mom's (L) breast in cross-cradle position after brief breast compression by LC as mom performs compression with C-hold on opposite side.  Rhythmical sucking bursts and intermittent swallows noted for 12 minutes and baby comes off on his own.  Mom denies nipple pain with feeding.  She prefers not using the NS but will use as needed.  LC reviewed assembly, use and cleaning of mom's personal Medela electric pump, as well as ebm storage guidelines (page 25 of Baby and Me) LC encouraged continuing with pre-pumping and latching, post-pump only if NS used or if additional stimulation desired.   Maternal Data    Feeding Feeding Type: Breast Fed Length of feed: 12 min  LATCH Score/Interventions Latch: Grasps breast easily, tongue down, lips flanged, rhythmical sucking. Intervention(s): Breast compression;Assist with latch;Adjust position (cross-cradle and more pillow support)  Audible Swallowing: Spontaneous and intermittent Intervention(s): Alternate breast massage;Hand expression;Skin to skin  Type of Nipple: Everted at rest and after stimulation (mom briefly pre-pumped with hand pump) Intervention(s): Hand pump  Comfort (Breast/Nipple): Filling, red/small blisters or bruises, mild/mod discomfort (light bruising persists but no new trauma or pinching)  Interventions (Mild/moderate discomfort): Pre-pump if needed;Hand expression  Hold (Positioning): Assistance needed to correctly position infant at breast and maintain latch. Intervention(s): Position options;Skin to skin;Support Pillows;Breastfeeding basics reviewed  LATCH Score: 8 (LC  observed and briefly assisted)  Lactation Tools Discussed/Used Pump Review: Setup, frequency, and cleaning;Milk Storage Initiated by:: LC assisted and demonstrated use of Medela DEBP Date initiated:: 07/07/14 Cue feedings, positioning and breast support/compression for latching  Consult Status Consult Status: Follow-up Date: 07/08/14 Follow-up type: In-patient    Warrick ParisianBryant, Leanor Voris [facility] 07/07/2014, 5:31 PM

## 2014-07-07 NOTE — Addendum Note (Signed)
Addendum  created 07/07/14 1752 by Cristela BlueKyle Cinque Begley, MD   Modules edited: Anesthesia Events, Narrator   Narrator:  Narrator: Event Log Edited

## 2014-07-07 NOTE — Progress Notes (Signed)
POSTOPERATIVE DAY # 2 S/P CS / thrombocytopenia  Working with lactation with initial visit - return later PM to examine Seen at 1710pm  S:         Reports feeling well - tired with minimal pain control             Tolerating po intake / no nausea / no vomiting / + flatus / no BM             Bleeding is light             Pain controlled with percocet 2 tab - states takes an hour to work then wears off about 30 minutes before next dose             Up ad lib / ambulatory/ voiding QS  Newborn breast feeding  / Circumcision planned   O:  VS: BP 126/61 mmHg  Pulse 71  Temp(Src) 99.1 F (37.3 C) (Oral)  Resp 20  Ht 5\' 2"  (1.575 m)  Wt 85.503 kg (188 lb 8 oz)  BMI 34.47 kg/m2  SpO2 95%  LMP 09/20/2013  Breastfeeding? Unknown   LABS:               Recent Labs  07/05/14 1418 07/06/14 0600 07/07/14 0540  WBC 14.8* 12.4*  --   HGB 12.2 9.9*  --   PLT 83* 80* 80*               Bloodtype: --/--/O POS, O POS (03/14 2210)  Rubella: Immune (08/03 0000)                                Physical Exam:             Alert and Oriented X3 / sitting up in chair breastfeeding  Lungs: Clear and unlabored  Heart: regular rate and rhythm / no mumurs  Abdomen: soft, non-tender, non-distended, active BS             Fundus: firm, non-tender, Ueven             Dressing intact     Extremities: 1+ pedal edema, no calf pain or tenderness  A:        POD # 2 S/P CS            Thrombocytopenia - stable at 80 - no rise yet            ABL anemia               P:        Routine postoperative care              Anticipate DC tomorrow             DC IV today             May recheck CBC tomorrow prior to DC - recommendation at that time for when to use Motrin                     Ok to restart after PLT above 100     Marlinda MikeBAILEY, Dontrail Blackwell CNM, MSN, [facility] 07/07/2014, 1710pm

## 2014-07-08 ENCOUNTER — Ambulatory Visit: Payer: Self-pay

## 2014-07-08 LAB — PLATELET COUNT: Platelets: 107 10*3/uL — ABNORMAL LOW (ref 150–400)

## 2014-07-08 MED ORDER — POLYSACCHARIDE IRON COMPLEX 150 MG PO CAPS: 150.0000 mg | ORAL_CAPSULE | Freq: Two times a day (BID) | ORAL | Status: DC

## 2014-07-08 MED ORDER — OXYCODONE-ACETAMINOPHEN 5-325 MG PO TABS: 1.0000 | ORAL_TABLET | ORAL | Status: DC | PRN

## 2014-07-08 MED ORDER — MAGNESIUM OXIDE 400 (241.3 MG) MG PO TABS: 200.0000 mg | ORAL_TABLET | Freq: Every day | ORAL | Status: DC

## 2014-07-08 MED ORDER — IBUPROFEN 600 MG PO TABS: 600.0000 mg | ORAL_TABLET | Freq: Four times a day (QID) | ORAL | Status: DC | PRN

## 2014-07-08 NOTE — Addendum Note (Signed)
Addendum  created 07/08/14 0917 by Cristela BlueKyle Shelley Cocke, MD   Modules edited: Anesthesia LDA, Lines/Drains/Airways Properties Editor   Lines/Drains/Airways Properties Editor:  Properties of line/drain/airway/wound [REMOVED] Epidural Catheter 07/05/14 have been modified.

## 2014-07-08 NOTE — Lactation Note (Signed)
This note was copied from the chart of Boy Annaliese Grill. Lactation Consultation Note: Infant is 7871 hours old and is 10 % weight loss. Mother aware of weight loss and is supplementing with EBM using a spoon per nurse. Mother is pumping 5-7 ml. She prefers not using the nipple shield. Mother declines any concerns or need for [facility] assistance when i followed up with her this pm. Mother has her own Medela pump. [facility] needed for feeding assessment . Mother to follow up with Peds on Monday for weight check.   Patient Name: Boy Teola BradleyDanielle Hinesley NWGNF'AToday's Date: 07/08/2014 Reason for consult: Follow-up assessment   Maternal Data    Feeding Feeding Type: Breast Fed Length of feed: 20 min  LATCH Score/Interventions Latch: Repeated attempts needed to sustain latch, nipple held in mouth throughout feeding, stimulation needed to elicit sucking reflex. Intervention(s): Adjust position;Assist with latch;Breast massage;Breast compression  Audible Swallowing: Spontaneous and intermittent Intervention(s): Skin to skin;Hand expression  Type of Nipple: Flat Intervention(s): Double electric pump;Reverse pressure  Comfort (Breast/Nipple): Filling, red/small blisters or bruises, mild/mod discomfort  Problem noted: Mild/Moderate discomfort Interventions (Mild/moderate discomfort): Hand expression  Hold (Positioning): Assistance needed to correctly position infant at breast and maintain latch.  LATCH Score: 6  Lactation Tools Discussed/Used     Consult Status      Michel BickersKendrick, Derk Doubek McCoy 07/08/2014, 12:27 PM

## 2014-07-08 NOTE — Discharge Summary (Signed)
POSTOPERATIVE DISCHARGE SUMMARY:  Patient ID: Rachael Chandler MRN: [id number] DOB/AGE: 1980-07-23 34 y.o.  Admit date: 07/04/2014 Admission Diagnoses: 38.[redacted] weeks gestation, term PROM   Discharge date: 07/08/2014 Discharge Diagnoses: S/P C/S on 07/05/2014, Thrombocytopenia of pregnancy, delivered; ABL anemia        Prenatal history: G1P1001   EDC: 06/30/2014, by Other Basis  [facility] [redacted] wks gestation. Primary provider: Dr. Billy Coastaavon Prenatal course complicated by thrombocytopenia  Prenatal labs: ABO, Rh: --/--/O POS, O POS (03/14 2210)  Antibody: NEG (03/14 2210) Rubella:   IMMUNE RPR: Non Reactive (03/14 2210)  HBsAg: Negative (08/03 0000)  HIV: Non-reactive (08/03 0000)  GBS: Negative (02/05 0000)  GTT: 95  Medical / Surgical History :  Past medical history:  Past Medical History  Diagnosis Date  . Ulcer     clinical dx; no Endoscopy  . Kidney stones      x 1  h/o  . GERD (gastroesophageal reflux disease)   . Headache(784.0)     otc meds prn  . 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 Sciatica of left side     in past  . Postpartum care following cesarean delivery (3/15) 07/05/2014    Past surgical history:  Past Surgical History  Procedure Laterality Date  . Fractured arm  1986    left  . Wisdom tooth extraction    . Dilatation & currettage/hysteroscopy with resectocope  02/26/2012    Procedure: DILATATION & CURETTAGE/HYSTEROSCOPY WITH RESECTOCOPE;  Surgeon: Purcell NailsAngela Y Roberts, MD;  Location: [facility];  Service: Gynecology;  Laterality: N/A;     . Cautherization    . Cesarean section N/A 07/05/2014    Procedure: CESAREAN SECTION;  Surgeon: Olivia Mackieichard Taavon, MD;  Location: [facility] ORS;  Service: Obstetrics;  Laterality: N/A;     Medications on Admission: Prescriptions prior to admission  Medication Sig Dispense Refill Last Dose  . calcium carbonate (TUMS - DOSED IN MG ELEMENTAL CALCIUM) 500 MG  chewable tablet Chew 2-3 tablets by mouth daily as needed for indigestion or heartburn.   Past Week at Unknown time  . Prenatal Vit-Fe Fumarate-FA (PRENATAL MULTIVITAMIN) TABS tablet Take 1 tablet by mouth daily at 12 noon.   07/04/2014 at Unknown time  . predniSONE (DELTASONE) 20 MG tablet Take 4 tablets (80 mg total) by mouth daily with breakfast. (Patient not taking: Reported on 07/04/2014) 60 tablet 0     Allergies: Review of patient's allergies indicates no known allergies.   Intrapartum Course:  Admitted for term PROM, oxytocin augmentation, epidural, arrest of descent, primary CS   Postpartum Course: Complicated by worsening thrombocytopenia on POD #1, then improvement on POD#3, and ABL anemia.  Physical Exam:   VSS: Blood pressure 122/76, pulse 75, temperature 98.5 F (36.9 C), temperature source Oral, resp. rate 18, height 5\' 2"  (1.575 m), weight 85.503 kg (188 lb 8 oz), last menstrual period 09/20/2013, SpO2 96 %, unknown if currently breastfeeding.  LABS:  Recent Labs  07/05/14 1418 07/06/14 0600 07/07/14 0540 07/08/14 0605  WBC 14.8* 12.4*  --   --   HGB 12.2 9.9*  --   --   PLT 83* 80* 80* 107*    General: Alert and oriented x3 Heart: RRR Lungs: CTA bilaterally GI: soft, non-tender, non-distended, BS x4 Lochia: small Uterus: firm below umbilicus Incision: well approximated; honeycomb dressing-no significant erythema, drainage, or edema Extremities: Trace BLE edema, Homans neg   Newborn Data Live born female  Birth Weight:  7 lb 5.8 oz (3340 g) APGAR: 7, 9  See operative report for further details  Home with mother.  Discharge Instructions:  Wound Care: keep clean and dry / remove honeycomb POD 6 Postpartum Instructions: [facility] discharge booklet - instructions reviewed Medications:    Medication List    STOP taking these medications        calcium carbonate 500 MG chewable tablet  Commonly known as:  TUMS - dosed in mg elemental calcium      predniSONE 20 MG tablet  Commonly known as:  DELTASONE      TAKE these medications        ibuprofen 600 MG tablet  Commonly known as:  MOTRIN IB  Take 1 tablet (600 mg total) by mouth every 6 (six) hours as needed.     iron polysaccharides 150 MG capsule  Commonly known as:  NIFEREX  Take 1 capsule (150 mg total) by mouth 2 (two) times daily.     magnesium oxide 400 (241.3 MG) MG tablet  Commonly known as:  MAG-OX  Take 0.5 tablets (200 mg total) by mouth daily.     oxyCODONE-acetaminophen 5-325 MG per tablet  Commonly known as:  PERCOCET/ROXICET  Take 1-2 tablets by mouth every 4 (four) hours as needed (for pain scale equal to or greater than 7).     prenatal multivitamin Tabs tablet  Take 1 tablet by mouth daily at 12 noon.            Follow-up Information    Follow up with Lenoard Aden, MD. Schedule an appointment as soon as possible for a visit in 6 weeks.   Specialty:  Obstetrics and Gynecology   Contact information:   [address] [place] [address] [phone]         Signed: Donette Larry, Dorris Carnes MSN, CNM 07/08/2014, 11:50 AM

## 2014-07-08 NOTE — Progress Notes (Signed)
POD # 3  Subjective: Pt reports feeling well/ Pain controlled with Percocet Tolerating po/Voiding without problems/ No n/v/ Flatus present Activity: ad lib Bleeding is light Newborn info:  Information for the patient's newborn:  Sedano, Franciso BendBoy Daneya [[id number]][[id number]]  female  / Circumcision: done/ Feeding: breast   Objective: VS: VS:  Filed Vitals:   07/06/14 1811 07/07/14 0618 07/07/14 1806 07/08/14 0609  BP: 129/73 126/61 119/77 122/76  Pulse: 73 71 91 75  Temp: 98.2 F (36.8 C) 99.1 F (37.3 C) 98.4 F (36.9 C) 98.5 F (36.9 C)  TempSrc: Oral Oral Oral Oral  Resp: 18 20  18   Height:      Weight:      SpO2:   96%     I&O: Intake/Output    None     LABS:  Recent Labs  07/05/14 1418 07/06/14 0600 07/07/14 0540 07/08/14 0605  WBC 14.8* 12.4*  --   --   HGB 12.2 9.9*  --   --   PLT 83* 80* 80* 107*                           Physical Exam:  General: alert and cooperative CV: Regular rate and rhythm Resp: CTA bilaterally Abdomen: soft, nontender, normal bowel sounds Incision: healing well, no drainage, no erythema, no hernia, no seroma, no swelling, well approximated, honeycomb dsg c/d/i Uterine Fundus: firm, below umbilicus, nontender Lochia: minimal Ext: edema trace BLE and Homans sign is negative, no sign of DVT    Assessment: POD # 3/ G1P1001/ S/P C/Section d/t arrest of descent Thrombocytopenia of pregnancy, delivered-improving ABL anemia Doing well and stable for discharge home  Plan: Discharge home RX's: Ibuprofen 600mg  po Q 6 hrs prn pain #30 Refill x 1 Percocet 5/325 1 - 2 tabs po every 4 hrs prn pain #30 Refill x 0 Niferex 150mg  po BID #60 Refill x 1 Mag Oxide 200 mg po daily #30, refill x1 [facility] booklet given    Signed: Lawernce PittsBHAMBRI, Philicia Heyne, N, MSN, CNM 07/08/2014, 9:13 AM

## 2014-08-10 ENCOUNTER — Ambulatory Visit (HOSPITAL_COMMUNITY)
Admission: RE | Admit: 2014-08-10 | Discharge: 2014-08-10 | Disposition: A | Discharge status: Home / Self Care | Payer: Managed Care, Other (non HMO) | Source: Ambulatory Visit | Attending: Obstetrics and Gynecology | Admitting: Obstetrics and Gynecology

## 2014-08-10 NOTE — Lactation Note (Signed)
Lactation Consult  Mother's reason for visit:  Over supply , fast let- down  Visit Type:  Feeding assessment  Appointment Notes:  Over supply - confirmed  Consult:  Initial Lactation Consultant:  Kathrin Greathouse  ________________________________________________________________________  Rachael Chandler Name: Rachael Chandler Date of Birth: 07/05/2014 Pediatrician: Dr. Aggie Hacker  Gender: female Gestational Age: [redacted]w[redacted]d (At Birth) Birth Weight: 7 lb 5.8 oz (3340 g) Weight at Discharge: Weight: 6 lb 10.4 oz (3015 g)Date of Discharge: 07/08/2014 [facility] Weights   07/05/14 2300 07/06/14 2343 07/08/14 0021  Weight: 7 lb 3.9 oz (3285 g) 6 lb 13.7 oz (3110 g) 6 lb 10.4 oz (3015 g)   [facility] 8-14oz , 4/5  Location:Pediatrician's office Weight today: 10.4 oz        ________________________________________________________________________  Mother's Name: Rachael Chandler Type of delivery:  C/Section  Breastfeeding Experience: 1st baby  Maternal Medical Conditions:   No risk for milk supply , per mom had to be tx with antibiotics due to cellulitis if the incision.  Maternal Medications:  PNV   ________________________________________________________________________  Breastfeeding History (Post Discharge)  Frequency of breastfeeding: lately , every hour , growth spurt , cluster feeding  Duration of feeding:  15 -30 mins   Supplementing: per mom just expressed breast milk , have given EBM in a bottle when I've pumped instead.   Pumping : permom has a DEBP Medela - lately post pumping 3-4 feedings to build up milk supply due to having to leave baby with  Kris Mouton ma for 2 weddings. Obtaining 4-6 oz off each breast .    Infant Intake and Output Assessment  Voids: 8  in 24 hrs.  Color:  Clear yellow Stools:  5 in 24 hrs.  Color:  Yellow  ________________________________________________________________________  Maternal  Breast Assessment - mom concerned about yeast infection. LC assessed breast tissue , noted the right nipple and areola to be pink with a fine non elevated rash. LC suspects yeast ( early stages ) no complains of pain with latch either breast. Left breast , nipple pinkly red . No rash noted   Breast:  Full Nipple:  Erect ( semi , areola compressible )  Pain level:  0 Pain interventions:  Expressed breast milk  _______________________________________________________________________ Feeding Assessment/Evaluation - per mom mentioned the baby is on reflux med Zantac twice a day , and it has improved  Per mom concerned baby may have thrush - LC assessed tongue after feeding after wiping the tongue with a cool clean wash cloth. [place] noted in the back of the tongue , also a fine raised pink rash on the inner aspects of the buttocks. Baby is very gasey.   Initial feeding assessment:  Infant's oral assessment:  Variance - short labial frenulum ( upper lip stretches well ), and suspect a posterior short frenulum   Positioning:  Cradle - LC showed mom how to latch with cross cradle 1st to obtain depth and then go to the cradle with firm support  Right breast  LATCH documentation:  Latch:  2 = Grasps breast easily, tongue down, lips flanged, rhythmical sucking.  Audible swallowing:  2 = Spontaneous and intermittent  Type of nipple:  2 = Everted at rest and after stimulation  Comfort (Breast/Nipple):  1 = Filling, red/small blisters or bruises, mild/mod discomfort  Hold (Positioning):  1 = Assistance needed to correctly position infant at breast and maintain latch  LATCH score:  8   Attached assessment:  Shallow  Lips flanged:  No.  Lips untucked:  Yes.    Suck assessment:  Nutritive  Tools:  None  Instructed on use and cleaning of tool:  No.  Pre-feed weight: 4654g , 10.4.2 oz  Post -feed weight: 4740g , 10.7.2 oz  Amount transferred:  76 ml  Amount supplemented: none    Additional Feeding Assessment -   Infant's oral assessment:  Variance - see above note   Positioning:  Cross cradle Left breast  LATCH documentation:  Latch:  2 = Grasps breast easily, tongue down, lips flanged, rhythmical sucking.  Audible swallowing:  2 = Spontaneous and intermittent  Type of nipple:  2 = Everted at rest and after stimulation  Comfort (Breast/Nipple):  1 = Filling, red/small blisters or bruises, mild/mod discomfort  Hold (Positioning):  1 = Assistance needed to correctly position infant at breast and maintain latch  LATCH score:  8   Attached assessment:  Shallow @ 1st , when mom was using the cradle position at 1st to latch and LC suggested  she try the cross cradle to obtain depth and then go to cradle   Lips flanged:  No. ( LC flipped upper lip to flanged position.   Lips untucked:  Yes.    Suck assessment:  Nutritive  Tools:  None  Instructed on use and cleaning of tool:  No. Stool changed - loose yellow   Pre-feed weight: 4724 g , 10.6.6 oz  Post-feed weight:  4762 g , 10.8.0 oz  Amount transferred: 38 ml  Amount supplemented:  None needed   Total amount pumped post feed:  Did not pump at consult   Total amount transferred:  114 ml  Total supplement given:  None   Lactation Impression: mom concerned about possible yeast infection ( see moms breast assessment and baby' oral assessment above note )  @ consult worked on baby to latch deeper , giving mom tips. Noted mom allowing the baby to latch on by himself. May be causing some of the redness On the areolas. Considering mom has been on antibiotics for tx of cellulitis and the the baby being symptomatic , probably needs to be tx . LC suggested  To mom to call her Pedis office and OB for appropriate meds for tx of yeast. Yeast tx sheet with discussion given to mom.  For over supply - ( LC did not get a clear picture from mom how much pumping she actually is doing). She is needing to store milk for 2  upcoming weddings When she will have to leave baby with grandma. LC suggested if to full to start hand express, or pre-pump with hand pump 10 -30  ml , so baby will get to the hindmilk  Quicker and it will help the reflux. Always have 2nd breast to go to . ( per mom baby routinely only feeds on one side) , or feed on one breast and pump to comfort 2nd breast  And save milk .Due volume being the greatest in am , best time to post  pump both breast 10 -15 mins.

## 2014-08-18 LAB — HM PAP SMEAR

## 2014-08-19 ENCOUNTER — Other Ambulatory Visit: Payer: Self-pay

## 2014-09-22 ENCOUNTER — Other Ambulatory Visit: Payer: Self-pay | Admitting: Physician Assistant

## 2014-10-25 ENCOUNTER — Ambulatory Visit: Payer: Managed Care, Other (non HMO) | Admitting: Podiatry

## 2015-05-05 ENCOUNTER — Ambulatory Visit: Payer: Managed Care, Other (non HMO) | Admitting: Family Medicine

## 2015-05-05 ENCOUNTER — Telehealth: Payer: Self-pay | Admitting: Family Medicine

## 2015-05-09 NOTE — Telephone Encounter (Signed)
Pt was no show for acute appt 05/05/15 8:30am, no prev no show, charge or no charge?

## 2015-05-10 NOTE — Telephone Encounter (Signed)
No charge this once.  Will get charged next time if she does not attempt to make contact

## 2015-05-10 NOTE — Telephone Encounter (Signed)
I will still mark to charge still

## 2015-08-18 ENCOUNTER — Encounter: Payer: Self-pay | Admitting: Family Medicine

## 2015-08-18 ENCOUNTER — Ambulatory Visit (INDEPENDENT_AMBULATORY_CARE_PROVIDER_SITE_OTHER): Payer: Managed Care, Other (non HMO) | Admitting: Family Medicine

## 2015-08-18 VITALS — BP 108/68 | HR 68 | Temp 98.0°F | Resp 16 | Ht 62.0 in | Wt 161.4 lb

## 2015-08-18 DIAGNOSIS — R040 Epistaxis: Secondary | ICD-10-CM

## 2015-08-18 DIAGNOSIS — R0789 Other chest pain: Secondary | ICD-10-CM

## 2015-08-18 DIAGNOSIS — R079 Chest pain, unspecified: Secondary | ICD-10-CM | POA: Insufficient documentation

## 2015-08-18 DIAGNOSIS — Z0000 Encounter for general adult medical examination without abnormal findings: Secondary | ICD-10-CM

## 2015-08-18 LAB — CBC WITH DIFFERENTIAL/PLATELET
Basophils Absolute: 0 10*3/uL (ref 0.0–0.1)
Basophils Relative: 0.8 % (ref 0.0–3.0)
EOS PCT: 3.4 % (ref 0.0–5.0)
Eosinophils Absolute: 0.1 10*3/uL (ref 0.0–0.7)
HCT: 40.6 % (ref 36.0–46.0)
HEMOGLOBIN: 14 g/dL (ref 12.0–15.0)
Lymphocytes Relative: 42.9 % (ref 12.0–46.0)
Lymphs Abs: 1.8 10*3/uL (ref 0.7–4.0)
MCHC: 34.5 g/dL (ref 30.0–36.0)
MCV: 92.2 fl (ref 78.0–100.0)
Monocytes Absolute: 0.4 10*3/uL (ref 0.1–1.0)
Monocytes Relative: 10.1 % (ref 3.0–12.0)
NEUTROS PCT: 42.8 % — AB (ref 43.0–77.0)
Neutro Abs: 1.8 10*3/uL (ref 1.4–7.7)
PLATELETS: 139 10*3/uL — AB (ref 150.0–400.0)
RBC: 4.4 Mil/uL (ref 3.87–5.11)
RDW: 13.2 % (ref 11.5–15.5)
WBC: 4.2 10*3/uL (ref 4.0–10.5)

## 2015-08-18 LAB — BASIC METABOLIC PANEL
BUN: 17 mg/dL (ref 6–23)
CO2: 30 mEq/L (ref 19–32)
CREATININE: 1.06 mg/dL (ref 0.40–1.20)
Calcium: 10 mg/dL (ref 8.4–10.5)
Chloride: 100 mEq/L (ref 96–112)
GFR: 62.85 mL/min (ref >60.00)
Glucose, Bld: 77 mg/dL (ref 70–99)
Potassium: 4.4 mEq/L (ref 3.5–5.1)
Sodium: 137 mEq/L (ref 135–145)

## 2015-08-18 LAB — HEPATIC FUNCTION PANEL
ALT: 19 U/L (ref 0–35)
AST: 20 U/L (ref 0–37)
Albumin: 4.4 g/dL (ref 3.5–5.2)
Alkaline Phosphatase: 53 U/L (ref 39–117)
Bilirubin, Direct: 0.1 mg/dL (ref 0.0–0.3)
Total Bilirubin: 0.8 mg/dL (ref 0.2–1.2)
Total Protein: 7.6 g/dL (ref 6.0–8.3)

## 2015-08-18 LAB — VITAMIN D 25 HYDROXY (VIT D DEFICIENCY, FRACTURES): VITD: 19.12 ng/mL — ABNORMAL LOW (ref 30.00–100.00)

## 2015-08-18 LAB — LIPID PANEL
Cholesterol: 222 mg/dL — ABNORMAL HIGH (ref 0–200)
HDL: 43.5 mg/dL (ref >39.00)
LDL CALC: 152 mg/dL — AB (ref 0–99)
NonHDL: 178.19
TRIGLYCERIDES: 132 mg/dL (ref 0.0–149.0)
Total CHOL/HDL Ratio: 5
VLDL: 26.4 mg/dL (ref 0.0–40.0)

## 2015-08-18 LAB — TSH: TSH: 1.2 u[IU]/mL (ref 0.35–4.50)

## 2015-08-18 NOTE — Progress Notes (Signed)
Pre visit review using our clinic review tool, if applicable. No additional management support is needed unless otherwise documented below in the visit note. 

## 2015-08-18 NOTE — Progress Notes (Signed)
   Subjective:    Patient ID: Rachael Chandler, female    DOB: [birth year], 35 y.o.   MRN: [id number]  HPI CPE- UTD on GYN [facility]([place]).  Nosebleeds- pt has hx of recurrent nosebleeds that have required cauterization in the past.  Has not had an episode in almost 2 weeks  CP- pt reports episode last night of CP, abd pain- radiating through to back, some associated SOB, mild nausea.  No dizziness or diaphoresis.  Had 1 similar episode ~2-3 months ago.  Both episodes occurred at rest.  Not occuring in setting of eating or fatty foods.  sxs tend to spontaneously resolve in 10-20 minutes.   Review of Systems Patient reports no vision/ hearing changes, adenopathy,fever, weight change,  persistant/recurrent hoarseness , swallowing issues, palpitations, edema, persistant/recurrent cough, hemoptysis, dyspnea (rest/exertional/paroxysmal nocturnal), gastrointestinal bleeding (melena, rectal bleeding), abdominal pain, significant heartburn, bowel changes, GU symptoms (dysuria, hematuria, incontinence), Gyn symptoms (abnormal  bleeding, pain),  syncope, focal weakness, memory loss, numbness & tingling, skin/hair/nail changes, anxiety, or depression.     Objective:   Physical Exam General Appearance:    Alert, cooperative, no distress, appears stated age  Head:    Normocephalic, without obvious abnormality, atraumatic  Eyes:    PERRL, conjunctiva/corneas clear, EOM's intact, fundi    benign, both eyes  Ears:    Normal TM's and external ear canals, both ears  Nose:   Nares normal, septum midline, mucosa normal, no drainage    or sinus tenderness  Throat:   Lips, mucosa, and tongue normal; teeth and gums normal  Neck:   Supple, symmetrical, trachea midline, no adenopathy;    Thyroid: no enlargement/tenderness/nodules  Back:     Symmetric, no curvature, ROM normal, no CVA tenderness  Lungs:     Clear to auscultation bilaterally, respirations unlabored  Chest Wall:    No tenderness or deformity   Heart:     Regular rate and rhythm, S1 and S2 normal, no murmur, rub   or gallop  Breast Exam:    Deferred to GYN  Abdomen:     Soft, non-tender, bowel sounds active all four quadrants,    no masses, no organomegaly  Genitalia:    Deferred to GYN  Rectal:    Extremities:   Extremities normal, atraumatic, no cyanosis or edema  Pulses:   2+ and symmetric all extremities  Skin:   Skin color, texture, turgor normal, no rashes or lesions  Lymph nodes:   Cervical, supraclavicular, and axillary nodes normal  Neurologic:   CNII-XII intact, normal strength, sensation and reflexes    throughout          Assessment & Plan:

## 2015-08-18 NOTE — Patient Instructions (Signed)
Follow up in 1 year or as needed We'll notify you of your lab results and make any changes if needed Your EKG looks great!  This is great news!!! Continue to work on healthy diet and regular exercise- you look great! If you continue to have nosebleeds- let me know and we'll refer you to ENT If you have another episode of chest pain- please let me know and we'll refer to cardiology for a complete work up Call with any questions or concerns Have a wonderful vacation!!!

## 2015-08-21 ENCOUNTER — Other Ambulatory Visit: Payer: Self-pay | Admitting: General Practice

## 2015-08-21 MED ORDER — VITAMIN D (ERGOCALCIFEROL) 1.25 MG (50000 UNIT) PO CAPS: 50000.0000 [IU] | ORAL_CAPSULE | ORAL | Status: DC

## 2015-08-21 NOTE — Assessment & Plan Note (Signed)
Ongoing issue for pt.  She thinks this is partly seasonal and due to her allergies.  I offered her an ENT referral but she declined at this time.  She is aware that she is able to contact me in the future if she wants to proceed w/ referral.

## 2015-08-21 NOTE — Assessment & Plan Note (Signed)
Pt's PE WNL w/ exception of weight.  UTD on pap, too young for mammo.  Check labs.  Anticipatory guidance provided.

## 2015-08-21 NOTE — Assessment & Plan Note (Signed)
New.  Pt has had 2 episodes of sharp chest pain radiating through to her back and associated w/ abd pain.  Unclear if this is gallbladder related- but it doesn't seem to occur w/ eating- vs esophageal spasm vs GERD.  Risk of cardiac etiology is very low in someone this age.  EKG WNL.  Reviewed supportive care and red flags that should prompt return.  Pt expressed understanding and is in agreement w/ plan.

## 2016-01-09 ENCOUNTER — Encounter: Payer: Self-pay | Admitting: Podiatry

## 2016-01-09 ENCOUNTER — Ambulatory Visit (INDEPENDENT_AMBULATORY_CARE_PROVIDER_SITE_OTHER): Payer: Managed Care, Other (non HMO)

## 2016-01-09 ENCOUNTER — Ambulatory Visit (INDEPENDENT_AMBULATORY_CARE_PROVIDER_SITE_OTHER): Payer: Managed Care, Other (non HMO) | Admitting: Podiatry

## 2016-01-09 VITALS — BP 103/59 | HR 74 | Resp 16 | Ht 63.0 in | Wt 162.0 lb

## 2016-01-09 DIAGNOSIS — M7670 Peroneal tendinitis, unspecified leg: Secondary | ICD-10-CM | POA: Diagnosis not present

## 2016-01-09 DIAGNOSIS — L603 Nail dystrophy: Secondary | ICD-10-CM

## 2016-01-09 DIAGNOSIS — M79673 Pain in unspecified foot: Secondary | ICD-10-CM | POA: Diagnosis not present

## 2016-01-09 MED ORDER — METHYLPREDNISOLONE 4 MG PO TBPK: ORAL_TABLET | ORAL | 0 refills | Status: DC

## 2016-01-09 NOTE — Progress Notes (Signed)
   Subjective:    Patient ID: Rachael Chandler, female    DOB: 1980-07-28, 35 y.o.   MRN: [id number]  HPI  Chief Complaint  Patient presents with  . Foot Pain    bilateral pain / lateral sides "kinda shape when I walk".  Pt states she's been playing beach volleyball and noticed the pain about a month ago.     She states that the pain has started to resolve but she wanted to have them checked anyway.    Review of Systems  All other systems reviewed and are negative.      Objective:   Physical Exam: Vital signs are stable she is alert and oriented 3. Pulses are palpable. Neurologic sensorium is intact per Semmes-Weinstein monofilament. Deep tendon reflexes are intact. An muscle strength +5 over 5 dorsiflexion plantar flexors and inverters everters all intrinsic musculature is intact. She has no reproducible pain on palpation or evaluation today. Radiographs do not demonstrate any type of osseus or soft tissue abnormalities that are visible.        Assessment & Plan:  Probable peroneal tendinitis that is resolving on its on.  Plan: Placed her on a Medrol Dosepak which should alleviate her symptoms should they recur she is to notify me immediately.

## 2016-04-23 ENCOUNTER — Ambulatory Visit: Payer: Managed Care, Other (non HMO) | Admitting: Physician Assistant

## 2016-04-25 ENCOUNTER — Ambulatory Visit (INDEPENDENT_AMBULATORY_CARE_PROVIDER_SITE_OTHER): Payer: Managed Care, Other (non HMO) | Admitting: Physician Assistant

## 2016-04-25 ENCOUNTER — Encounter: Payer: Self-pay | Admitting: Physician Assistant

## 2016-04-25 VITALS — BP 100/58 | HR 89 | Temp 98.8°F | Resp 14 | Ht 62.0 in | Wt 170.0 lb

## 2016-04-25 DIAGNOSIS — B9689 Other specified bacterial agents as the cause of diseases classified elsewhere: Secondary | ICD-10-CM

## 2016-04-25 DIAGNOSIS — J208 Acute bronchitis due to other specified organisms: Secondary | ICD-10-CM | POA: Diagnosis not present

## 2016-04-25 MED ORDER — BENZONATATE 100 MG PO CAPS: 100.0000 mg | ORAL_CAPSULE | Freq: Three times a day (TID) | ORAL | 0 refills | Status: DC | PRN

## 2016-04-25 MED ORDER — AZITHROMYCIN 250 MG PO TABS: ORAL_TABLET | ORAL | 0 refills | Status: DC

## 2016-04-25 NOTE — Progress Notes (Signed)
Patient presents to clinic today c/o 2.5  weeks of intermittent nasal congestion with rhinorrhea and cough that is wet per patient. Denies fevers, chills, chest pain. Notes chest congestion worsening over the past week. Denies sinus pain, ear pain or tooth pain. Endorses some seasonal allergies for which she will take Allegra and Flonase on an as needed basis. Denies recent travel or sick contact.    Past Medical History:  Diagnosis Date  . GERD (gastroesophageal reflux disease)   . Headache(784.0)    otc meds prn  . 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 Kidney stones     x 1  h/o  . Postpartum care following cesarean delivery (3/15) 07/05/2014  . Sciatica of left side    in past  . Ulcer (HCC)    clinical dx; no Endoscopy    Current Outpatient Prescriptions on File Prior to Visit  Medication Sig Dispense Refill  . fluticasone (FLONASE) 50 MCG/ACT nasal spray Place 2 sprays into both nostrils daily.     No current facility-administered medications on file prior to visit.     No Known Allergies  Family History  Problem Relation Age of Onset  . Alcohol abuse Father   . Cancer Father     liver  . Alcohol abuse Mother   . Sudden death Mother     bleeding internal and could not do surgery because of stomach cancer  . Stomach cancer Mother   . Alcohol abuse Maternal Grandmother   . Hyperlipidemia Maternal Grandmother   . Hypertension Maternal Grandmother   . Diabetes Maternal Grandmother   . Renal Disease Maternal Grandmother   . Heart disease Paternal Grandmother     Social History   Social History  . Marital status: Married    Spouse name: N/A  . Number of children: N/A  . Years of education: N/A   Social History Main Topics  . Smoking status: Never Smoker  . Smokeless tobacco: Never Used  . Alcohol use No  . Drug use: No  . Sexual activity: Yes    Birth control/ protection: None   Other Topics Concern  . None   Social  History Narrative  . None   Review of Systems - See HPI.  All other ROS are negative.  BP (!) 100/58   Pulse 89   Temp 98.8 F (37.1 C) (Oral)   Resp 14   Ht 5\' 2"  (1.575 m)   Wt 170 lb (77.1 kg)   SpO2 98%   BMI 31.09 kg/m   Physical Exam  Constitutional: She is oriented to person, place, and time and well-developed, well-nourished, and in no distress.  HENT:  Head: Normocephalic and atraumatic.  Right Ear: Tympanic membrane and external ear normal.  Left Ear: Tympanic membrane and external ear normal.  Nose: Nose normal.  Mouth/Throat: Oropharynx is clear and moist. No oropharyngeal exudate.  Eyes: Conjunctivae are normal.  Cardiovascular: Normal rate, regular rhythm, normal heart sounds and intact distal pulses.   Pulmonary/Chest: Effort normal and breath sounds normal. No respiratory distress. She has no wheezes. She has no rales. She exhibits no tenderness.  Neurological: She is alert and oriented to person, place, and time.  Skin: Skin is warm and dry. No rash noted.  Psychiatric: Affect normal.  Vitals reviewed.  Assessment/Plan: 1. Acute bacterial bronchitis 2.5 weeks of symptoms. Initial improvement then acute worsening. Afebrile. Lungs CTAB. Start Azithromycin. Supportive measures and OTC medications reviewed. Restart allergy medications. FU if not  resolving.   - azithromycin (ZITHROMAX) 250 MG tablet; Take 2 tablets on Day 1. Then take 1 tablet daily.  Dispense: 6 tablet; Refill: 0   Piedad Climes, [place]

## 2016-04-25 NOTE — Patient Instructions (Signed)
Take antibiotic (Azithromycin) as directed.  Increase fluids.  Get plenty of rest. Use Mucinex for congestion. Restart your Allegra and Flonase. Take a daily probiotic (I recommend Align or Culturelle, but even Activia Yogurt may be beneficial).  A humidifier placed in the bedroom may offer some relief for a dry, scratchy throat of nasal irritation.  Read information below on acute bronchitis. Please call or return to clinic if symptoms are not improving.  Acute Bronchitis Bronchitis is when the airways that extend from the windpipe into the lungs get red, puffy, and painful (inflamed). Bronchitis often causes thick spit (mucus) to develop. This leads to a cough. A cough is the most common symptom of bronchitis. In acute bronchitis, the condition usually begins suddenly and goes away over time (usually in 2 weeks). Smoking, allergies, and asthma can make bronchitis worse. Repeated episodes of bronchitis may cause more lung problems.  HOME CARE  Rest.  Drink enough fluids to keep your pee (urine) clear or pale yellow (unless you need to limit fluids as told by your doctor).  Only take over-the-counter or prescription medicines as told by your doctor.  Avoid smoking and secondhand smoke. These can make bronchitis worse. If you are a smoker, think about using nicotine gum or skin patches. Quitting smoking will help your lungs heal faster.  Reduce the chance of getting bronchitis again by:  Washing your hands often.  Avoiding people with cold symptoms.  Trying not to touch your hands to your mouth, nose, or eyes.  Follow up with your doctor as told.  GET HELP IF: Your symptoms do not improve after 1 week of treatment. Symptoms include:  Cough.  Fever.  Coughing up thick spit.  Body aches.  Chest congestion.  Chills.  Shortness of breath.  Sore throat.  GET HELP RIGHT AWAY IF:   You have an increased fever.  You have chills.  You have severe shortness of breath.  You  have bloody thick spit (sputum).  You throw up (vomit) often.  You lose too much body fluid (dehydration).  You have a severe headache.  You faint.  MAKE SURE YOU:   Understand these instructions.  Will watch your condition.  Will get help right away if you are not doing well or get worse. Document Released: 09/25/2007 Document Revised: 12/09/2012 Document Reviewed: 09/29/2012 [facility] Patient Information 2015 [place], [place]. This information is not intended to replace advice given to you by your health care provider. Make sure you discuss any questions you have with your health care provider.

## 2016-04-25 NOTE — Progress Notes (Signed)
Pre visit review using our clinic review tool, if applicable. No additional management support is needed unless otherwise documented below in the visit note. 

## 2016-07-03 ENCOUNTER — Encounter: Payer: Self-pay | Admitting: Family Medicine

## 2016-07-03 ENCOUNTER — Ambulatory Visit (INDEPENDENT_AMBULATORY_CARE_PROVIDER_SITE_OTHER): Payer: Managed Care, Other (non HMO) | Admitting: Family Medicine

## 2016-07-03 VITALS — BP 102/72 | HR 78 | Temp 99.0°F | Resp 16 | Ht 62.0 in | Wt 167.4 lb

## 2016-07-03 DIAGNOSIS — J0100 Acute maxillary sinusitis, unspecified: Secondary | ICD-10-CM

## 2016-07-03 MED ORDER — AMOXICILLIN 875 MG PO TABS: 875.0000 mg | ORAL_TABLET | Freq: Two times a day (BID) | ORAL | 0 refills | Status: DC

## 2016-07-03 NOTE — Progress Notes (Signed)
Pre visit review using our clinic review tool, if applicable. No additional management support is needed unless otherwise documented below in the visit note. 

## 2016-07-03 NOTE — Progress Notes (Signed)
   Subjective:    Patient ID: Rachael Chandler, female    DOB: 17-Mar-1981, 36 y.o.   MRN: [id number]  HPI URI- pt reports she thinks she has 'a sinus infxn'.  Pt reports cold like sxs x10 days but in the last 2 days has developed increased facial pressure, nasal congestion.  No tooth pain.  No fever.  No ear pain.  No N/V.  + cough- productive.  + sick contacts.   Review of Systems For ROS see HPI     Objective:   Physical Exam  Constitutional: She appears well-developed and well-nourished. No distress.  HENT:  Head: Normocephalic and atraumatic.  Right Ear: Tympanic membrane normal.  Left Ear: Tympanic membrane normal.  Nose: Mucosal edema and rhinorrhea present. Right sinus exhibits maxillary sinus tenderness. Right sinus exhibits no frontal sinus tenderness. Left sinus exhibits maxillary sinus tenderness. Left sinus exhibits no frontal sinus tenderness.  Mouth/Throat: Uvula is midline and mucous membranes are normal. Posterior oropharyngeal erythema present. No oropharyngeal exudate.  Eyes: Conjunctivae and EOM are normal. Pupils are equal, round, and reactive to light.  Neck: Normal range of motion. Neck supple.  Cardiovascular: Normal rate, regular rhythm and normal heart sounds.   Pulmonary/Chest: Effort normal and breath sounds normal. No respiratory distress. She has no wheezes.  Lymphadenopathy:    She has no cervical adenopathy.  Vitals reviewed.         Assessment & Plan:  Maxillary sinusitis- new.  Pt's sxs and PE consistent w/ infxn.  Start abx.  Add daily allergy meds.  Reviewed supportive care and red flags that should prompt return.  Pt expressed understanding and is in agreement w/ plan.

## 2016-07-03 NOTE — Patient Instructions (Signed)
Schedule your complete physical at your convenience Start the Amoxicillin twice daily- take w/ food Drink plenty of fluids Start daily Claritin or Zyrtec for the allergy component REST! Call with any questions or concerns Enjoy the party!!!

## 2016-08-19 ENCOUNTER — Ambulatory Visit (INDEPENDENT_AMBULATORY_CARE_PROVIDER_SITE_OTHER): Payer: 59 | Admitting: Family Medicine

## 2016-08-19 ENCOUNTER — Encounter: Payer: Self-pay | Admitting: Family Medicine

## 2016-08-19 VITALS — BP 103/76 | HR 83 | Temp 98.1°F | Resp 16 | Ht 62.0 in | Wt 166.2 lb

## 2016-08-19 DIAGNOSIS — Z0001 Encounter for general adult medical examination with abnormal findings: Secondary | ICD-10-CM | POA: Diagnosis not present

## 2016-08-19 DIAGNOSIS — Z0000 Encounter for general adult medical examination without abnormal findings: Secondary | ICD-10-CM | POA: Diagnosis not present

## 2016-08-19 DIAGNOSIS — M545 Low back pain, unspecified: Secondary | ICD-10-CM

## 2016-08-19 LAB — LIPID PANEL
Cholesterol: 161 mg/dL (ref 0–200)
HDL: 38.7 mg/dL — AB (ref >39.00)
LDL Cholesterol: 88 mg/dL (ref 0–99)
NonHDL: 121.97
Total CHOL/HDL Ratio: 4
Triglycerides: 169 mg/dL — ABNORMAL HIGH (ref 0.0–149.0)
VLDL: 33.8 mg/dL (ref 0.0–40.0)

## 2016-08-19 LAB — CBC WITH DIFFERENTIAL/PLATELET
Basophils Absolute: 0 10*3/uL (ref 0.0–0.1)
Basophils Relative: 0.8 % (ref 0.0–3.0)
EOS PCT: 4.6 % (ref 0.0–5.0)
Eosinophils Absolute: 0.2 10*3/uL (ref 0.0–0.7)
HEMATOCRIT: 40.7 % (ref 36.0–46.0)
Hemoglobin: 13.8 g/dL (ref 12.0–15.0)
LYMPHS ABS: 1.6 10*3/uL (ref 0.7–4.0)
Lymphocytes Relative: 29.6 % (ref 12.0–46.0)
MCHC: 33.8 g/dL (ref 30.0–36.0)
MCV: 93.5 fl (ref 78.0–100.0)
Monocytes Absolute: 0.4 10*3/uL (ref 0.1–1.0)
Monocytes Relative: 7.6 % (ref 3.0–12.0)
Neutro Abs: 3.1 10*3/uL (ref 1.4–7.7)
Neutrophils Relative %: 57.4 % (ref 43.0–77.0)
Platelets: 143 10*3/uL — ABNORMAL LOW (ref 150.0–400.0)
RBC: 4.36 Mil/uL (ref 3.87–5.11)
RDW: 13.5 % (ref 11.5–15.5)
WBC: 5.4 10*3/uL (ref 4.0–10.5)

## 2016-08-19 LAB — BASIC METABOLIC PANEL
BUN: 13 mg/dL (ref 6–23)
CHLORIDE: 107 meq/L (ref 96–112)
CO2: 28 mEq/L (ref 19–32)
Calcium: 9.5 mg/dL (ref 8.4–10.5)
Creatinine, Ser: 0.76 mg/dL (ref 0.40–1.20)
GFR: 91.73 mL/min (ref >60.00)
Glucose, Bld: 87 mg/dL (ref 70–99)
Potassium: 4.1 mEq/L (ref 3.5–5.1)
Sodium: 140 mEq/L (ref 135–145)

## 2016-08-19 LAB — VITAMIN D 25 HYDROXY (VIT D DEFICIENCY, FRACTURES): VITD: 26.19 ng/mL — ABNORMAL LOW (ref 30.00–100.00)

## 2016-08-19 LAB — HEPATIC FUNCTION PANEL
ALT: 14 U/L (ref 0–35)
AST: 14 U/L (ref 0–37)
Albumin: 4.4 g/dL (ref 3.5–5.2)
Alkaline Phosphatase: 47 U/L (ref 39–117)
Bilirubin, Direct: 0.2 mg/dL (ref 0.0–0.3)
Total Bilirubin: 1.1 mg/dL (ref 0.2–1.2)
Total Protein: 7 g/dL (ref 6.0–8.3)

## 2016-08-19 LAB — TSH: TSH: 2.51 u[IU]/mL (ref 0.35–4.50)

## 2016-08-19 NOTE — Progress Notes (Signed)
   Subjective:    Patient ID: Rachael Chandler, female    DOB: 06/02/1980, 36 y.o.   MRN: [id number]  HPI CPE- UTD on pap.   Review of Systems Patient reports no vision/ hearing changes, adenopathy,fever, weight change,  persistant/recurrent hoarseness , swallowing issues, chest pain, palpitations, edema, persistant/recurrent cough, hemoptysis, dyspnea (rest/exertional/paroxysmal nocturnal), gastrointestinal bleeding (melena, rectal bleeding), abdominal pain, significant heartburn, bowel changes, GU symptoms (dysuria, hematuria, incontinence), Gyn symptoms (abnormal  bleeding, pain),  syncope, focal weakness, memory loss, numbness & tingling, skin/hair/nail changes, abnormal bruising or bleeding, anxiety, or depression.   + back pain- pt reports she is unable to lie on her back due to LBP that radiates around to stomach.  Pain w/ movement.  Rarely takes NSAIDs.  Hx of mild scoliosis.    Objective:   Physical Exam General Appearance:    Alert, cooperative, no distress, appears stated age, overweight  Head:    Normocephalic, without obvious abnormality, atraumatic  Eyes:    PERRL, conjunctiva/corneas clear, EOM's intact, fundi    benign, both eyes  Ears:    Normal TM's and external ear canals, both ears  Nose:   Nares normal, septum midline, mucosa normal, no drainage    or sinus tenderness  Throat:   Lips, mucosa, and tongue normal; teeth and gums normal  Neck:   Supple, symmetrical, trachea midline, no adenopathy;    Thyroid: no enlargement/tenderness/nodules  Back:     Symmetric, + lumbar lordosis  Lungs:     Clear to auscultation bilaterally, respirations unlabored  Chest Wall:    No tenderness or deformity   Heart:    Regular rate and rhythm, S1 and S2 normal, no murmur, rub   or gallop  Breast Exam:    Deferred to GYN  Abdomen:     Soft, non-tender, bowel sounds active all four quadrants,    no masses, no organomegaly  Genitalia:    Deferred to GYN  Rectal:    Extremities:    Extremities normal, atraumatic, no cyanosis or edema  Pulses:   2+ and symmetric all extremities  Skin:   Skin color, texture, turgor normal, no rashes or lesions  Lymph nodes:   Cervical, supraclavicular, and axillary nodes normal  Neurologic:   CNII-XII intact, normal strength, sensation and reflexes    throughout          Assessment & Plan:

## 2016-08-19 NOTE — Progress Notes (Signed)
Pre visit review using our clinic review tool, if applicable. No additional management support is needed unless otherwise documented below in the visit note. 

## 2016-08-19 NOTE — Patient Instructions (Signed)
Follow up in 1 year or as needed We'll notify you of your lab results and make any changes if needed Continue to work on healthy diet and regular exercise- you can do it! We'll call you with your Spine appt to address the back pain Call with any questions or concerns [place]!!!

## 2016-08-19 NOTE — Assessment & Plan Note (Signed)
Pt's PE WNL w/ exception of being overweight and mild lumbar lordosis.  UTD on pap, Tdap.  Check labs.  Anticipatory guidance provided.

## 2016-08-20 ENCOUNTER — Encounter: Payer: Self-pay | Admitting: General Practice

## 2016-12-09 ENCOUNTER — Other Ambulatory Visit: Payer: Self-pay | Admitting: Orthopaedic Surgery

## 2016-12-09 DIAGNOSIS — M545 Low back pain: Secondary | ICD-10-CM

## 2016-12-17 ENCOUNTER — Other Ambulatory Visit: Payer: 59

## 2016-12-25 ENCOUNTER — Ambulatory Visit
Admission: RE | Admit: 2016-12-25 | Discharge: 2016-12-25 | Disposition: A | Discharge status: Home / Self Care | Payer: 59 | Source: Ambulatory Visit | Attending: Orthopaedic Surgery | Admitting: Orthopaedic Surgery

## 2016-12-25 DIAGNOSIS — M545 Low back pain: Secondary | ICD-10-CM

## 2017-03-27 ENCOUNTER — Encounter (HOSPITAL_COMMUNITY): Payer: Self-pay | Admitting: Emergency Medicine

## 2017-03-27 ENCOUNTER — Emergency Department (HOSPITAL_COMMUNITY)
Admission: EM | Admit: 2017-03-27 | Discharge: 2017-03-27 | Disposition: A | Discharge status: Home / Self Care | Payer: 59 | Attending: Emergency Medicine | Admitting: Emergency Medicine

## 2017-03-27 ENCOUNTER — Emergency Department (HOSPITAL_COMMUNITY): Payer: 59

## 2017-03-27 ENCOUNTER — Other Ambulatory Visit: Payer: Self-pay

## 2017-03-27 DIAGNOSIS — R079 Chest pain, unspecified: Secondary | ICD-10-CM | POA: Diagnosis present

## 2017-03-27 DIAGNOSIS — R0789 Other chest pain: Secondary | ICD-10-CM | POA: Diagnosis not present

## 2017-03-27 DIAGNOSIS — Z79899 Other long term (current) drug therapy: Secondary | ICD-10-CM | POA: Insufficient documentation

## 2017-03-27 LAB — CBC WITH DIFFERENTIAL/PLATELET
BASOS PCT: 1 %
Basophils Absolute: 0 10*3/uL (ref 0.0–0.1)
EOS ABS: 0.3 10*3/uL (ref 0.0–0.7)
Eosinophils Relative: 5 %
HCT: 38.3 % (ref 36.0–46.0)
Hemoglobin: 13.3 g/dL (ref 12.0–15.0)
Lymphocytes Relative: 40 %
Lymphs Abs: 2.4 10*3/uL (ref 0.7–4.0)
MCH: 32.1 pg (ref 26.0–34.0)
MCHC: 34.7 g/dL (ref 30.0–36.0)
MCV: 92.5 fL (ref 78.0–100.0)
MONO ABS: 0.4 10*3/uL (ref 0.1–1.0)
MONOS PCT: 7 %
NEUTROS PCT: 47 %
Neutro Abs: 2.9 10*3/uL (ref 1.7–7.7)
PLATELETS: 137 10*3/uL — AB (ref 150–400)
RBC: 4.14 MIL/uL (ref 3.87–5.11)
RDW: 12.9 % (ref 11.5–15.5)
WBC: 5.9 10*3/uL (ref 4.0–10.5)

## 2017-03-27 LAB — BASIC METABOLIC PANEL
Anion gap: 7 (ref 5–15)
BUN: 14 mg/dL (ref 6–20)
CALCIUM: 9.4 mg/dL (ref 8.9–10.3)
CO2: 26 mmol/L (ref 22–32)
CREATININE: 0.72 mg/dL (ref 0.44–1.00)
Chloride: 104 mmol/L (ref 101–111)
GFR calc non Af Amer: >60 mL/min (ref >60)
GLUCOSE: 99 mg/dL (ref 65–99)
Potassium: 3.7 mmol/L (ref 3.5–5.1)
Sodium: 137 mmol/L (ref 135–145)

## 2017-03-27 LAB — I-STAT TROPONIN, ED
TROPONIN I, POC: 0.01 ng/mL (ref 0.00–0.08)
Troponin i, poc: 0 ng/mL (ref 0.00–0.08)

## 2017-03-27 LAB — PREGNANCY, URINE: Preg Test, Ur: NEGATIVE

## 2017-03-27 LAB — D-DIMER, QUANTITATIVE: D-Dimer, Quant: 0.3 ug/mL-FEU (ref 0.00–0.50)

## 2017-03-27 MED ORDER — OMEPRAZOLE 20 MG PO CPDR: 20.0000 mg | DELAYED_RELEASE_CAPSULE | Freq: Every day | ORAL | 0 refills | Status: DC

## 2017-03-27 MED ORDER — GI COCKTAIL ~~LOC~~
30.0000 mL | Freq: Once | ORAL | Status: AC
  Administered 2017-03-27: 30 mL via ORAL
  Filled 2017-03-27: qty 30

## 2017-03-27 MED ORDER — KETOROLAC TROMETHAMINE 30 MG/ML IJ SOLN
15.0000 mg | Freq: Once | INTRAMUSCULAR | Status: AC
  Administered 2017-03-27: 15 mg via INTRAVENOUS
  Filled 2017-03-27: qty 1

## 2017-03-27 NOTE — Discharge Instructions (Signed)
Your workup including imaging and lab work has been very reassuring.  Unknown cause of your chest pain.  No signs of blood clot or heart attack.  Make sure you take anti-inflammatories to help with chest wall pain.  Also given a prescription for Prilosec which help with acid reflux.  Follow-up with your primary care doctor for further referral if necessary.  Return to the ED if your chest pain becomes exertional, worsening shortness of breath or for any new reasons.

## 2017-03-27 NOTE — ED Triage Notes (Signed)
Pt states she has been having chest pain since this afternoon  Pt states she picked up her 36 yr old and had a shooting pain in her chest  Pt states since then it has been a squeezing sensation  Pt states it is worse when she lays down  Pt is c/o pain in her neck and shoulders

## 2017-03-27 NOTE — ED Provider Notes (Signed)
[facility] DEPT Provider Note   CSN: [id number] Arrival date & time: 03/27/17  0112     History   Chief Complaint Chief Complaint  Patient presents with  . Chest Pain    HPI Rachael Chandler is a 36 y.o. female.  HPI 36 year old Caucasian female past medical history significant for GERD presents to the emergency department today for evaluation of chest pain.  Patient states that around 1 PM this afternoon she picked up HER-87-year-old daughter and started having shooting pain in her substernal chest.  Patient describes as a squeezing sensation.  States that it resolved on its own after a few seconds.  States that however it came back at 10:00 this evening.  States that it is worse when she lies down.  Reports radiation to her back and shoulders.  Reports mild shortness of breath.  Denies any associated diaphoresis, nausea or emesis.  Patient denies any cardiac history.  Denies any history of PE/DVT, prolonged immobilization, recent hospitalization/surgeries, unilateral leg swelling, calf tenderness.  Patient states earlier today she did have a pain in her right lateral thigh that quickly resolved.  Denies any pain at this time denies any associated swelling.  Patient denies any OCP use or tobacco use.  Denies any significant family history.  Nothing makes her symptoms better or worse.  She is not training for her symptoms prior to arrival.  The chest pain is nonexertional and not pleuritic in nature.  Pt denies any fever, chill, ha, vision changes, lightheadedness, dizziness, congestion, neck pain, cough, abd pain, n/v/d, urinary symptoms, change in bowel habits, melena, hematochezia, lower extremity paresthesias.  Past Medical History:  Diagnosis Date  . GERD (gastroesophageal reflux disease)   . Headache(784.0)    otc meds prn  . 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 Kidney stones     x 1  h/o  . Postpartum  care following cesarean delivery (3/15) 07/05/2014  . Sciatica of left side    in past  . Ulcer    clinical dx; no Endoscopy    Patient Active Problem List   Diagnosis Date Noted  . Physical exam 08/18/2015  . Chest pain 08/18/2015  . Epistaxis, recurrent 05/12/2014  . 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 cautherization    . CESAREAN SECTION N/A 07/05/2014   Procedure: CESAREAN SECTION;  Surgeon: Brien Few, MD;  Location: [facility];  Service: Obstetrics;  Laterality: N/A;  . DILATATION & CURRETTAGE/HYSTEROSCOPY WITH RESECTOCOPE  02/26/2012   Procedure: [place];  Surgeon: Delice Lesch, MD;  Location: [facility];  Service: Gynecology;  Laterality: N/A;     . fractured arm  1986   left  . WISDOM TOOTH EXTRACTION      OB History    Gravida Para Term Preterm AB Living   _0 SAB TAB Ectopic Multiple Live Births         0 1       Home Medications    Prior to Admission medications   Medication Sig Start Date End Date Taking? Authorizing Provider  Elderberry 575 MG/5ML SYRP Take 5 mLs by mouth daily as needed (cold).   Yes [provider]  omeprazole (PRILOSEC) 20 MG capsule Take 1 capsule (20 mg total) by mouth daily. 03/27/17   Doristine Devoid, PA-C  Family History Family History  Problem Relation Age of Onset  . Alcohol abuse Father   . Cancer Father        liver  . Alcohol abuse Mother   . Sudden death Mother        bleeding internal and could not do surgery because of stomach cancer  . Stomach cancer Mother   . Alcohol abuse Maternal Grandmother   . Hyperlipidemia Maternal Grandmother   . Hypertension Maternal Grandmother   . Diabetes Maternal Grandmother   . Renal Disease Maternal Grandmother   . Heart disease Paternal Grandmother     Social History Social History   Tobacco Use  . Smoking  status: Never Smoker  . Smokeless tobacco: Never Used  Substance Use Topics  . Alcohol use: Yes    Comment: social  . Drug use: No     Allergies   Patient has no known allergies.   Review of Systems Review of Systems  Constitutional: Negative for chills, diaphoresis and fever.  HENT: Negative for congestion.   Eyes: Negative for visual disturbance.  Respiratory: Positive for shortness of breath. Negative for cough.   Cardiovascular: Positive for chest pain. Negative for palpitations and leg swelling.  Gastrointestinal: Negative for abdominal pain, diarrhea, nausea and vomiting.  Genitourinary: Negative for dysuria, flank pain, frequency, hematuria and urgency.  Musculoskeletal: Negative for arthralgias and myalgias.  Skin: Negative for rash.  Neurological: Negative for dizziness, syncope, weakness, light-headedness, numbness and headaches.  Psychiatric/Behavioral: Negative for sleep disturbance. The patient is not nervous/anxious.      Physical Exam Updated Vital Signs BP 101/82 (BP Location: Left Arm)   Pulse 61   Temp 97.7 F (36.5 C) (Oral)   Resp 18   LMP 03/25/2017 (Exact Date)   SpO2 97%   Physical Exam  Constitutional: She is oriented to person, place, and time. She appears well-developed and well-nourished.  Non-toxic appearance. No distress.  HENT:  Head: Normocephalic and atraumatic.  Nose: Nose normal.  Mouth/Throat: Oropharynx is clear and moist.  Eyes: Conjunctivae are normal. Pupils are equal, round, and reactive to light. Right eye exhibits no discharge. Left eye exhibits no discharge.  Neck: Normal range of motion. Neck supple. No JVD present. No tracheal deviation present.  Cardiovascular: Normal rate, regular rhythm, normal heart sounds and intact distal pulses. Exam reveals no gallop and no friction rub.  No murmur heard. Pulmonary/Chest: Effort normal and breath sounds normal. No stridor. No respiratory distress. She has no wheezes. She has no  rales. She exhibits tenderness.  No hypoxia or tachypnea.  Abdominal: Soft. Bowel sounds are normal. She exhibits no distension. There is no tenderness. There is no rebound and no guarding.  Musculoskeletal: Normal range of motion.  No lower extremity edema or calf tenderness.  Lymphadenopathy:    She has no cervical adenopathy.  Neurological: She is alert and oriented to person, place, and time.  Skin: Skin is warm and dry. Capillary refill takes less than 2 seconds. She is not diaphoretic.  Psychiatric: Her behavior is normal. Judgment and thought content normal.  Nursing note and vitals reviewed.    ED Treatments / Results  Labs (all labs ordered are listed, but only abnormal results are displayed) Labs Reviewed  CBC WITH DIFFERENTIAL/PLATELET - Abnormal; Notable for the following components:      Result Value   Platelets 137 (*)    All other components within normal limits  BASIC METABOLIC PANEL  PREGNANCY, URINE  D-DIMER, QUANTITATIVE (NOT AT [facility])  I-STAT TROPONIN, ED  I-STAT TROPONIN, ED    EKG  EKG Interpretation  Date/Time:  Thursday March 27 2017 01:22:45 EST Ventricular Rate:  88 PR Interval:    QRS Duration: 85 QT Interval:  379 QTC Calculation: 459 R Axis:   70 Text Interpretation:  Sinus rhythm Low voltage, precordial leads Confirmed by Veryl Speak [phone]) on 03/27/2017 1:35:37 AM       Radiology Dg Chest 2 View  Result Date: 03/27/2017 CLINICAL DATA:  36 year old female with mid chest pain. EXAM: CHEST  2 VIEW COMPARISON:  None. FINDINGS: The heart size and mediastinal contours are within normal limits. Both lungs are clear. The visualized skeletal structures are unremarkable. IMPRESSION: No active cardiopulmonary disease. Electronically Signed   By: Anner Crete M.D.   On: 03/27/2017 02:54    Procedures Procedures (including critical care time)  Medications Ordered in ED Medications  gi cocktail (Maalox,Lidocaine,Donnatal) (30 mLs Oral  Given 03/27/17 0234)  ketorolac (TORADOL) 30 MG/ML injection 15 mg (15 mg Intravenous Given 03/27/17 0234)     Initial Impression / Assessment and Plan / ED Course  I have reviewed the triage vital signs and the nursing notes.  Pertinent labs & imaging results that were available during my care of the patient were reviewed by me and considered in my medical decision making (see chart for details).     Pt presents to the Ed today with complaints of cp. Patient is to be discharged with recommendation to follow up with PCP in regards to today's hospital visit. Chest pain is not likely of cardiac or pulmonary etiology d/t presentation, d-dimer negative, VSS, no tracheal deviation, no JVD or new murmur, RRR, breath sounds equal bilaterally, EKG without any change from prior tracing and shows no signs of ischemia, negative delta troponin, and negative CXR.  The patient's heart pathway score is 1.  Making her low risk for ACS which is very atypical.  Low suspicion for PE, dissection, pneumonia.  Patient felt improved after GI cocktail and NSAIDs.  The patient does have a thrombocytopenia however history of same.  pt has been advised to return to the ED is CP becomes exertional, associated with diaphoresis or nausea, radiates to left jaw/arm, worsens or becomes concerning in any way. Pt appears reliable for follow up and is agreeable to discharge.      Final Clinical Impressions(s) / ED Diagnoses   Final diagnoses:  Atypical chest pain    ED Discharge Orders        Ordered    omeprazole (PRILOSEC) 20 MG capsule  Daily     03/27/17 0610       Doristine Devoid, PA-C 03/27/17 2202    Veryl Speak, MD 03/27/17 [phone]

## 2017-04-04 ENCOUNTER — Ambulatory Visit: Payer: 59 | Admitting: Physician Assistant

## 2017-04-04 ENCOUNTER — Encounter: Payer: Self-pay | Admitting: Physician Assistant

## 2017-04-04 ENCOUNTER — Other Ambulatory Visit: Payer: Self-pay

## 2017-04-04 VITALS — BP 110/82 | HR 74 | Temp 98.3°F | Resp 14 | Ht 62.0 in | Wt 169.0 lb

## 2017-04-04 DIAGNOSIS — K219 Gastro-esophageal reflux disease without esophagitis: Secondary | ICD-10-CM | POA: Diagnosis not present

## 2017-04-04 DIAGNOSIS — N63 Unspecified lump in unspecified breast: Secondary | ICD-10-CM | POA: Insufficient documentation

## 2017-04-04 DIAGNOSIS — R0609 Other forms of dyspnea: Secondary | ICD-10-CM

## 2017-04-04 DIAGNOSIS — R0789 Other chest pain: Secondary | ICD-10-CM

## 2017-04-04 LAB — H. PYLORI ANTIBODY, IGG: H PYLORI IGG: NEGATIVE

## 2017-04-04 MED ORDER — GI COCKTAIL ~~LOC~~
30.0000 mL | Freq: Once | ORAL | Status: AC
  Administered 2017-04-04: 30 mL via ORAL

## 2017-04-04 NOTE — Progress Notes (Signed)
Patient presents to clinic today for ER follow-up. Patient presented to the ER on 03/27/17 with c/o sternal chest pain. ER workup included EKG (normal sinus rhythm),  CBC (unremarkable except for mild thrombocytopenia), Troponin (negative) and CXR (negative). GI Cocktail was given with significant improvement in symptoms. Patient was discharged home with PCP follow-up. Was to start Prilosec 20 mg daily. Since discharge, patient endorses doing well overall. Has had some similar episodes of discomfort but notes associated heartburn. Denies nausea, vomiting or change to bowel/bladder habits. Is not taking PPI as directed. Denies any new or worsening symptoms. Denies exertional symptoms..   Past Medical History:  Diagnosis Date  . GERD (gastroesophageal reflux disease)   . Headache(784.0)    otc meds prn  . 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 Kidney stones     x 1  h/o  . Postpartum care following cesarean delivery (3/15) 07/05/2014  . Sciatica of left side    in past  . Ulcer    clinical dx; no Endoscopy    Current Outpatient Medications on File Prior to Visit  Medication Sig Dispense Refill  . Elderberry 575 MG/5ML SYRP Take 5 mLs by mouth daily as needed (cold).    Marland Kitchen omeprazole (PRILOSEC) 20 MG capsule Take 1 capsule (20 mg total) by mouth daily. (Patient not taking: Reported on 04/04/2017) 30 capsule 0   No current facility-administered medications on file prior to visit.     No Known Allergies  Family History  Problem Relation Age of Onset  . Alcohol abuse Father   . Cancer Father        liver  . Alcohol abuse Mother   . Sudden death Mother        bleeding internal and could not do surgery because of stomach cancer  . Stomach cancer Mother   . Alcohol abuse Maternal Grandmother   . Hyperlipidemia Maternal Grandmother   . Hypertension Maternal Grandmother   . Diabetes Maternal Grandmother   . Renal Disease Maternal Grandmother     . Heart disease Paternal Grandmother     Social History   Socioeconomic History  . Marital status: Married    Spouse name: None  . Number of children: None  . Years of education: None  . Highest education level: None  Social Needs  . Financial resource strain: None  . Food insecurity - worry: None  . Food insecurity - inability: None  . Transportation needs - medical: None  . Transportation needs - non-medical: None  Occupational History  . None  Tobacco Use  . Smoking status: Never Smoker  . Smokeless tobacco: Never Used  Substance and Sexual Activity  . Alcohol use: Yes    Comment: social  . Drug use: No  . Sexual activity: Yes    Birth control/protection: None  Other Topics Concern  . None  Social History Narrative  . None   Review of Systems - See HPI.  All other ROS are negative.  BP 110/82   Pulse 74   Temp 98.3 F (36.8 C) (Oral)   Resp 14   Ht _0  (1.575 m)   Wt 169 lb (76.7 kg)   LMP 03/25/2017 (Exact Date)   SpO2 96%   BMI 30.91 kg/m   Physical Exam  Constitutional: She is oriented to person, place, and time and well-developed, well-nourished, and in no distress.  HENT:  Head: Normocephalic and atraumatic.  Eyes: Conjunctivae are normal. Pupils are equal, round,  and reactive to light.  Neck: Neck supple.  Cardiovascular: Normal rate, regular rhythm, normal heart sounds and intact distal pulses.  Pulmonary/Chest: Effort normal and breath sounds normal. No respiratory distress. She has no wheezes. She has no rales. She exhibits no tenderness.  Abdominal: Normal appearance and bowel sounds are normal. There is no hepatosplenomegaly. There is tenderness in the epigastric area.  Neurological: She is alert and oriented to person, place, and time. No cranial nerve deficit.  Skin: Skin is warm and dry. No rash noted.  Psychiatric: Affect normal.  Vitals reviewed.  Recent Results (from the past 2160 hour(s))  Basic metabolic panel     Status: None    Collection Time: 03/27/17  2:00 AM  Result Value Ref Range   Sodium 137 135 - 145 mmol/L   Potassium 3.7 3.5 - 5.1 mmol/L   Chloride 104 101 - 111 mmol/L   CO2 26 22 - 32 mmol/L   Glucose, Bld 99 65 - 99 mg/dL   BUN 14 6 - 20 mg/dL   Creatinine, Ser 0.72 0.44 - 1.00 mg/dL   Calcium 9.4 8.9 - 10.3 mg/dL   GFR calc non Af Amer >60 >60 mL/min   GFR calc Af Amer >60 >60 mL/min    Comment: (NOTE) The eGFR has been calculated using the CKD EPI equation. This calculation has not been validated in all clinical situations. eGFR's persistently <60 mL/min signify possible Chronic Kidney Disease.    Anion gap 7 5 - 15  CBC with Differential     Status: Abnormal   Collection Time: 03/27/17  2:00 AM  Result Value Ref Range   WBC 5.9 4.0 - 10.5 K/uL   RBC 4.14 3.87 - 5.11 MIL/uL   Hemoglobin 13.3 12.0 - 15.0 g/dL   HCT 38.3 36.0 - 46.0 %   MCV 92.5 78.0 - 100.0 fL   MCH 32.1 26.0 - 34.0 pg   MCHC 34.7 30.0 - 36.0 g/dL   RDW 12.9 11.5 - 15.5 %   Platelets 137 (L) 150 - 400 K/uL   Neutrophils Relative % 47 %   Neutro Abs 2.9 1.7 - 7.7 K/uL   Lymphocytes Relative 40 %   Lymphs Abs 2.4 0.7 - 4.0 K/uL   Monocytes Relative 7 %   Monocytes Absolute 0.4 0.1 - 1.0 K/uL   Eosinophils Relative 5 %   Eosinophils Absolute 0.3 0.0 - 0.7 K/uL   Basophils Relative 1 %   Basophils Absolute 0.0 0.0 - 0.1 K/uL  D-dimer, quantitative (not at [facility])     Status: None   Collection Time: 03/27/17  2:00 AM  Result Value Ref Range   D-Dimer, Quant 0.30 0.00 - 0.50 ug/mL-FEU    Comment: (NOTE) At the manufacturer cut-off of 0.50 ug/mL FEU, this assay has been documented to exclude PE with a sensitivity and negative predictive value of 97 to 99%.  At this time, this assay has not been approved by the FDA to exclude DVT/VTE. Results should be correlated with clinical presentation.   I-stat troponin, ED     Status: None   Collection Time: 03/27/17  2:03 AM  Result Value Ref Range   Troponin i, poc 0.01 0.00  - 0.08 ng/mL   Comment 3            Comment: Due to the release kinetics of cTnI, a negative result within the first hours of the onset of symptoms does not rule out myocardial infarction with certainty. If myocardial infarction is  still suspected, repeat the test at appropriate intervals.   Pregnancy, urine     Status: None   Collection Time: 03/27/17  2:23 AM  Result Value Ref Range   Preg Test, Ur NEGATIVE NEGATIVE    Comment:        THE SENSITIVITY OF THIS METHODOLOGY IS >20 mIU/mL.   I-stat troponin, ED     Status: None   Collection Time: 03/27/17  5:39 AM  Result Value Ref Range   Troponin i, poc 0.00 0.00 - 0.08 ng/mL   Comment 3            Comment: Due to the release kinetics of cTnI, a negative result within the first hours of the onset of symptoms does not rule out myocardial infarction with certainty. If myocardial infarction is still suspected, repeat the test at appropriate intervals.     Assessment/Plan: 1. Gastroesophageal reflux disease without esophagitis ER workup negative. Low suspicion of cardiac cause. Symptoms seem consistent with GERD. Patient has not started PPI given by ER MD. Will check H. Pylori IgG. GI cocktail given in office with relief. Patient with concerns of cardiac cause despite negative workup and classic GERD symptoms. Will order OP stress test just to make sure things are all right and to give her piece of mind.  - H. pylori antibody, IgG - gi cocktail (Maalox,Lidocaine,Donnatal)   Leeanne Rio, PA-C

## 2017-04-04 NOTE — Patient Instructions (Signed)
Please go to the lab today for blood work.  I will call you with your results. We will alter treatment regimen(s) if indicated by your results.   Please keep well-hydrated and eat a balanced diet low in acidic foods. (see diet recommendations below).  Take the Omeprazole daily.  Also, start a probiotic. Avoid any late-night eating.  Follow-up in 7-10 days.  If you note any exertional chest pain or racing heart, shortness of breath, please go to the ER for assessment.

## 2017-04-29 ENCOUNTER — Ambulatory Visit (INDEPENDENT_AMBULATORY_CARE_PROVIDER_SITE_OTHER): Payer: 59

## 2017-04-29 DIAGNOSIS — R0789 Other chest pain: Secondary | ICD-10-CM | POA: Diagnosis not present

## 2017-04-29 DIAGNOSIS — R0609 Other forms of dyspnea: Secondary | ICD-10-CM

## 2017-04-29 LAB — EXERCISE TOLERANCE TEST
CHL CUP RESTING HR STRESS: 79 {beats}/min
CHL RATE OF PERCEIVED EXERTION: 16
CSEPEDS: 0 s
CSEPEW: 10.1 METS
CSEPHR: 90 %
Exercise duration (min): 9 min
MPHR: 184 {beats}/min
Peak HR: 166 {beats}/min

## 2017-10-07 ENCOUNTER — Ambulatory Visit: Payer: 59 | Admitting: Family Medicine

## 2017-11-27 ENCOUNTER — Telehealth: Payer: Self-pay | Admitting: Family Medicine

## 2017-11-27 NOTE — Telephone Encounter (Signed)
Called and spoke with patient. Advised her that I would have our charge correction team remove the bad debt balance from her account. I distinctly remember speaking with patient at the time of the incident and billing shouldve been reversed then. I apologized as well as provided the patient with my direct email should she have any further questions around this issue. Dawn- is there a Psychologist, prison and probation servicesbilling mailbox in Epic?

## 2017-11-27 NOTE — Telephone Encounter (Signed)
HI Marchelle FolksAmanda,  Not to my knowledge, I always email Charge correction or Melvenia Beamicole Mockler in this situation since it is going on so long.

## 2017-11-27 NOTE — Telephone Encounter (Signed)
Copied from CRM [phone]#[id number]. Topic: Complaint - Care >> Nov 12, 2017 10:50 AM Herby AbrahamJohnson, Shiquita C wrote: Pt called in to speak with Office Admin. Pt says that she and her spouse Clemencia CourseMichael Heindel DOB: 09/09/1981 are both [facility] pt's. Pt says that in 2017 and 2018 she and her spouse had back to back CPE's and recevied the incorrect A1C results. They received one another's results which as a results had her spouse not taking medication as needed for a entire year. Pt is very upset about this situation. Pt says also since then she has been also receiving a 25.00 bill and is now getting threatening calls from billing office/ collection because she refuse to pay for the bill due to error. Pt says that their A1C was checked multiple times due to error (showing on bill) which is why she is getting a bill and feels that it is on the office that A1C had to be checked multiple times. She said that she didn't want to bring the incident to the Administrators attention but feels that she has to considering. I did apologize repeatedly and advised that she would receive a call back.   Pt would like further assistance with this.  CB: [phone] Route to Practice Administrator.

## 2017-11-28 ENCOUNTER — Encounter: Payer: Self-pay | Admitting: Family Medicine

## 2017-11-28 ENCOUNTER — Other Ambulatory Visit: Payer: Self-pay

## 2017-11-28 ENCOUNTER — Ambulatory Visit: Payer: 59 | Admitting: Family Medicine

## 2017-11-28 DIAGNOSIS — J301 Allergic rhinitis due to pollen: Secondary | ICD-10-CM

## 2017-11-28 DIAGNOSIS — J309 Allergic rhinitis, unspecified: Secondary | ICD-10-CM | POA: Insufficient documentation

## 2017-11-28 MED ORDER — FLUTICASONE PROPIONATE 50 MCG/ACT NA SUSP: 2.0000 | Freq: Every day | NASAL | 6 refills | Status: DC

## 2017-11-28 NOTE — Progress Notes (Signed)
   Subjective:    Patient ID: Rachael Chandler, female    DOB: 1981/01/01, 37 y.o.   MRN: [id number]  HPI URI- pt reports 'I feel really great' but she 'lost sense of smell and taste for last 4 days'.  + nasal congestion but 'didn't feel sick'.  No fevers.  + sinus pressure.  No tooth pain.  No ear pain.  Sense of smell and taste is returning.  No known sick contacts.  + seasonal allergies- particularly fall.  Not currently on medication.  Pt has hx of losing smell and taste w/ allergy inflammation   Review of Systems For ROS see HPI     Objective:   Physical Exam  Constitutional: She appears well-developed and well-nourished. No distress.  HENT:  Head: Normocephalic and atraumatic.  Right Ear: Tympanic membrane normal.  Left Ear: Tympanic membrane normal.  Nose: Mucosal edema and rhinorrhea present. Right sinus exhibits no maxillary sinus tenderness and no frontal sinus tenderness. Left sinus exhibits no maxillary sinus tenderness and no frontal sinus tenderness.  Mouth/Throat: Mucous membranes are normal. Posterior oropharyngeal erythema (w/ PND) present.  Eyes: Pupils are equal, round, and reactive to light. Conjunctivae and EOM are normal.  Neck: Normal range of motion. Neck supple.  Cardiovascular: Normal rate, regular rhythm and normal heart sounds.  Pulmonary/Chest: Effort normal and breath sounds normal. No respiratory distress. She has no wheezes. She has no rales.  Lymphadenopathy:    She has no cervical adenopathy.  Vitals reviewed.         Assessment & Plan:

## 2017-11-28 NOTE — Assessment & Plan Note (Signed)
Deteriorated.  No evidence of infxn but likely sinus inflammation from untreated allergies.  Restart Allegra and add Flonase.  Reviewed supportive care and red flags that should prompt return.  Pt expressed understanding and is in agreement w/ plan.

## 2017-11-28 NOTE — Patient Instructions (Signed)
Follow up as needed or as scheduled RESTART the Allegra daily ADD the Flonase- 2 sprays each nostril daily Drink plenty of fluids Call with any questions or concerns Hang in there!!

## 2018-02-04 ENCOUNTER — Other Ambulatory Visit: Payer: Self-pay

## 2018-02-04 ENCOUNTER — Encounter: Payer: Self-pay | Admitting: General Practice

## 2018-02-04 ENCOUNTER — Ambulatory Visit (INDEPENDENT_AMBULATORY_CARE_PROVIDER_SITE_OTHER): Payer: 59 | Admitting: Family Medicine

## 2018-02-04 ENCOUNTER — Encounter: Payer: Self-pay | Admitting: Family Medicine

## 2018-02-04 VITALS — BP 110/82 | HR 78 | Temp 98.4°F | Resp 16 | Ht 62.0 in | Wt 173.5 lb

## 2018-02-04 DIAGNOSIS — E669 Obesity, unspecified: Secondary | ICD-10-CM

## 2018-02-04 DIAGNOSIS — E559 Vitamin D deficiency, unspecified: Secondary | ICD-10-CM

## 2018-02-04 DIAGNOSIS — Z809 Family history of malignant neoplasm, unspecified: Secondary | ICD-10-CM | POA: Diagnosis not present

## 2018-02-04 DIAGNOSIS — Z0000 Encounter for general adult medical examination without abnormal findings: Secondary | ICD-10-CM | POA: Diagnosis not present

## 2018-02-04 LAB — CBC WITH DIFFERENTIAL/PLATELET
BASOS ABS: 0.1 10*3/uL (ref 0.0–0.1)
BASOS PCT: 1.2 % (ref 0.0–3.0)
EOS ABS: 0.2 10*3/uL (ref 0.0–0.7)
EOS PCT: 4.8 % (ref 0.0–5.0)
HEMATOCRIT: 40 % (ref 36.0–46.0)
HEMOGLOBIN: 13.6 g/dL (ref 12.0–15.0)
LYMPHS PCT: 30.2 % (ref 12.0–46.0)
Lymphs Abs: 1.4 10*3/uL (ref 0.7–4.0)
MCHC: 34.1 g/dL (ref 30.0–36.0)
MCV: 93.3 fl (ref 78.0–100.0)
MONO ABS: 0.3 10*3/uL (ref 0.1–1.0)
Monocytes Relative: 6.6 % (ref 3.0–12.0)
NEUTROS ABS: 2.6 10*3/uL (ref 1.4–7.7)
Neutrophils Relative %: 57.2 % (ref 43.0–77.0)
Platelets: 152 10*3/uL (ref 150.0–400.0)
RBC: 4.29 Mil/uL (ref 3.87–5.11)
RDW: 13.4 % (ref 11.5–15.5)
WBC: 4.5 10*3/uL (ref 4.0–10.5)

## 2018-02-04 LAB — LIPID PANEL
Cholesterol: 160 mg/dL (ref 0–200)
HDL: 36.1 mg/dL — ABNORMAL LOW (ref >39.00)
LDL Cholesterol: 91 mg/dL (ref 0–99)
NONHDL: 123.61
Total CHOL/HDL Ratio: 4
Triglycerides: 162 mg/dL — ABNORMAL HIGH (ref 0.0–149.0)
VLDL: 32.4 mg/dL (ref 0.0–40.0)

## 2018-02-04 LAB — BASIC METABOLIC PANEL
BUN: 15 mg/dL (ref 6–23)
CO2: 26 meq/L (ref 19–32)
Calcium: 9 mg/dL (ref 8.4–10.5)
Chloride: 107 mEq/L (ref 96–112)
Creatinine, Ser: 0.89 mg/dL (ref 0.40–1.20)
GFR: 75.83 mL/min (ref >60.00)
Glucose, Bld: 92 mg/dL (ref 70–99)
Potassium: 3.9 mEq/L (ref 3.5–5.1)
SODIUM: 139 meq/L (ref 135–145)

## 2018-02-04 LAB — HEPATIC FUNCTION PANEL
ALT: 18 U/L (ref 0–35)
AST: 15 U/L (ref 0–37)
Albumin: 4.4 g/dL (ref 3.5–5.2)
Alkaline Phosphatase: 45 U/L (ref 39–117)
BILIRUBIN DIRECT: 0.2 mg/dL (ref 0.0–0.3)
BILIRUBIN TOTAL: 1.4 mg/dL — AB (ref 0.2–1.2)
Total Protein: 6.8 g/dL (ref 6.0–8.3)

## 2018-02-04 LAB — VITAMIN D 25 HYDROXY (VIT D DEFICIENCY, FRACTURES): VITD: 29.2 ng/mL — ABNORMAL LOW (ref 30.00–100.00)

## 2018-02-04 LAB — TSH: TSH: 3.28 u[IU]/mL (ref 0.35–4.50)

## 2018-02-04 NOTE — Progress Notes (Signed)
   Subjective:    Patient ID: Rachael Chandler, female    DOB: January 27, 1981, 37 y.o.   MRN: [id number]  HPI CPE- UTD on Tdap, pap tomorrow.   Review of Systems Patient reports no vision/ hearing changes, adenopathy,fever, weight change,  persistant/recurrent hoarseness , swallowing issues, chest pain, palpitations, edema, persistant/recurrent cough, hemoptysis, dyspnea (rest/exertional/paroxysmal nocturnal), gastrointestinal bleeding (melena, rectal bleeding), abdominal pain, significant heartburn, bowel changes, GU symptoms (dysuria, hematuria, incontinence), Gyn symptoms (abnormal  bleeding, pain),  syncope, focal weakness, memory loss, numbness & tingling, skin/hair/nail changes, abnormal bruising or bleeding, anxiety, or depression.     Objective:   Physical Exam General Appearance:    Alert, cooperative, no distress, appears stated age  Head:    Normocephalic, without obvious abnormality, atraumatic  Eyes:    PERRL, conjunctiva/corneas clear, EOM's intact, fundi    benign, both eyes  Ears:    Normal TM's and external ear canals, both ears  Nose:   Nares normal, septum midline, mucosa normal, no drainage    or sinus tenderness  Throat:   Lips, mucosa, and tongue normal; teeth and gums normal  Neck:   Supple, symmetrical, trachea midline, no adenopathy;    Thyroid: no enlargement/tenderness/nodules  Back:     Symmetric, no curvature, ROM normal, no CVA tenderness  Lungs:     Clear to auscultation bilaterally, respirations unlabored  Chest Wall:    No tenderness or deformity   Heart:    Regular rate and rhythm, S1 and S2 normal, no murmur, rub   or gallop  Breast Exam:    Deferred to GYN  Abdomen:     Soft, non-tender, bowel sounds active all four quadrants,    no masses, no organomegaly  Genitalia:    Deferred to GYN  Rectal:    Extremities:   Extremities normal, atraumatic, no cyanosis or edema  Pulses:   2+ and symmetric all extremities  Skin:   Skin color, texture, turgor normal,  no rashes or lesions  Lymph nodes:   Cervical, supraclavicular, and axillary nodes normal  Neurologic:   CNII-XII intact, normal strength, sensation and reflexes    throughout          Assessment & Plan:

## 2018-02-04 NOTE — Patient Instructions (Signed)
Follow up in 1 year or as needed We'll notify you of your lab results and make any changes if needed Continue to work on healthy diet and regular exercise- you look great!! We'll call you with your Genetics appt Call with any questions or concerns Happy Fall!!!

## 2018-02-04 NOTE — Assessment & Plan Note (Signed)
Pt's PE WNL w/ exception of obesity.  GYN appt tomorrow.  Check labs.  Anticipatory guidance provided.

## 2018-02-04 NOTE — Assessment & Plan Note (Signed)
Pt has hx of this.  Check labs and replete prn. 

## 2018-02-11 ENCOUNTER — Encounter: Payer: Self-pay | Admitting: Licensed Clinical Social Worker

## 2018-02-11 ENCOUNTER — Telehealth: Payer: Self-pay | Admitting: Licensed Clinical Social Worker

## 2018-02-11 NOTE — Telephone Encounter (Signed)
A genetic counseling appt has been scheduled for the pt to see Lacy Duverney on 11/21 at 11am. Letter mailed to the pt.

## 2018-03-11 ENCOUNTER — Encounter: Payer: 59 | Admitting: Genetic Counselor

## 2018-03-12 ENCOUNTER — Inpatient Hospital Stay: Payer: 59

## 2018-03-12 ENCOUNTER — Encounter: Payer: Self-pay | Admitting: Licensed Clinical Social Worker

## 2018-03-12 ENCOUNTER — Other Ambulatory Visit: Payer: 59

## 2018-03-12 ENCOUNTER — Inpatient Hospital Stay: Payer: 59 | Admitting: Licensed Clinical Social Worker

## 2018-03-12 ENCOUNTER — Inpatient Hospital Stay: Payer: 59 | Attending: Genetic Counselor | Admitting: Licensed Clinical Social Worker

## 2018-03-12 ENCOUNTER — Encounter: Payer: 59 | Admitting: Licensed Clinical Social Worker

## 2018-03-12 DIAGNOSIS — Z7183 Encounter for nonprocreative genetic counseling: Secondary | ICD-10-CM | POA: Diagnosis not present

## 2018-03-12 DIAGNOSIS — Z8042 Family history of malignant neoplasm of prostate: Secondary | ICD-10-CM

## 2018-03-12 DIAGNOSIS — Z800 Family history of malignant neoplasm of digestive organs: Secondary | ICD-10-CM

## 2018-03-12 NOTE — Progress Notes (Addendum)
REFERRING PROVIDER: Midge Minium, MD [address] [place], [place] [address]  PRIMARY PROVIDER:  Midge Minium, MD  PRIMARY REASON FOR VISIT:  1. Family history of stomach cancer   2. Family history of liver cancer   3. Family history of prostate cancer      HISTORY OF PRESENT ILLNESS:   Rachael Chandler, a 37 y.o. female, was seen for a [place] cancer genetics consultation at the request of Dr. Birdie Riddle due to a family history of stomach cancer.  Rachael Chandler presents to clinic today to discuss the possibility of a hereditary predisposition to cancer, genetic testing, and to further clarify her future cancer risks, as well as potential cancer risks for family members.   Rachael Chandler is a 37 y.o. female with no personal history of cancer.    HORMONAL RISK FACTORS:  Menarche was at age 3.  First live birth at age 52.  OCP use: "Not often".  Ovaries intact: yes.  Hysterectomy: no.  Menopausal status: premenopausal.  HRT use: 0 years. Colonoscopy: no; not examined. Mammogram within the last year: no. Number of breast biopsies: 0. Any excessive radiation exposure in the past:  no  Past Medical History:  Diagnosis Date  . Family history of liver cancer   . Family history of prostate cancer   . Family history of stomach cancer   . GERD (gastroesophageal reflux disease)   . Headache(784.0)    otc meds prn  . 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 Kidney stones     x 1  h/o  . Postpartum care following cesarean delivery (3/15) 07/05/2014  . Sciatica of left side    in past  . Ulcer    clinical dx; no Endoscopy    Past Surgical History:  Procedure Laterality Date  . cautherization    . CESAREAN SECTION N/A 07/05/2014   Procedure: CESAREAN SECTION;  Surgeon: Brien Few, MD;  Location: [facility];  Service: Obstetrics;  Laterality: N/A;  . DILATATION & CURRETTAGE/HYSTEROSCOPY WITH RESECTOCOPE  02/26/2012   Procedure:  [place];  Surgeon: Delice Lesch, MD;  Location: [facility];  Service: Gynecology;  Laterality: N/A;     . fractured arm  1986   left  . WISDOM TOOTH EXTRACTION      Social History   Socioeconomic History  . Marital status: Married    Spouse name: Not on file  . Number of children: Not on file  . Years of education: Not on file  . Highest education level: Not on file  Occupational History  . Not on file  Social Needs  . Financial resource strain: Not on file  . Food insecurity:    Worry: Not on file    Inability: Not on file  . Transportation needs:    Medical: Not on file    Non-medical: Not on file  Tobacco Use  . Smoking status: Never Smoker  . Smokeless tobacco: Never Used  Substance and Sexual Activity  . Alcohol use: Yes    Comment: social  . Drug use: No  . Sexual activity: Yes    Birth control/protection: None  Lifestyle  . Physical activity:    Days per week: Not on file    Minutes per session: Not on file  . Stress: Not on file  Relationships  . Social connections:    Talks on phone: Not on file    Gets together: Not on file  Attends religious service: Not on file    Active member of club or organization: Not on file    Attends meetings of clubs or organizations: Not on file    Relationship status: Not on file  Other Topics Concern  . Not on file  Social History Narrative  . Not on file     FAMILY HISTORY:  We obtained a detailed, 4-generation family history.  Significant diagnoses are listed below: Family History  Problem Relation Age of Onset  . Alcohol abuse Father   . Cancer Father        liver, d.54  . Alcohol abuse Mother   . Sudden death Mother        bleeding internal and could not do surgery because of stomach cancer  . Stomach cancer Mother 23  . Alcohol abuse Maternal Grandmother   . Hyperlipidemia Maternal Grandmother   . Hypertension Maternal Grandmother   . Diabetes Maternal  Grandmother   . Renal Disease Maternal Grandmother   . Heart disease Paternal Grandmother   . Prostate cancer Maternal Uncle   . Cancer Paternal Uncle        unk type   Rachael Chandler was raised by her half-brothers grandparents, and therefore has fairly limited information about her biological family. She has one son, Rachael Chandler who is 65 years old. She has a half-brother through her mom named Rachael Chandler, 84. She has a half-sister through her mom, Rachael Chandler, 105. She has two nephews.   Rachael Chandler's mother was diagnosed with stomach cancer at the age of 80 and died at 60 due to internal bleeding. Her mom had a full brother and full sister. The sister died in her 67s, possibly of cancer. The brother is living. Rachael Chandler's mother also had a half brother through her mom who had prostate cancer. No cancer in Rachael Chandler's cousins that she is aware of. Her maternal grandmother died in her late 51's, no information about her grandfather.  Rachael Chandler's father was diagnosed with liver cancer at 67 and died at 25. He was adopted by one of his aunts but the patient does not know which one or which side of the family. He did have three sisters and two brothers. One of his brothers died young from cancer, unknown type. The patient's paternal grandmother died in her 66s, no information about paternal grandfather.   Ms. Padovano is unaware of previous family history of genetic testing for hereditary cancer risks. Patient's ancestors are of Northern European/Western European descent There is no reported Ashkenazi Jewish ancestry. There is no known consanguinity.  GENETIC COUNSELING ASSESSMENT: Lundon Chandler is a 37 y.o. female with a family history of stomach cancer which is somewhat suggestive of a Hereditary Cancer Syndrome and predisposition to cancer. We, therefore, discussed and recommended the following at today's visit.   DISCUSSION:  We discussed that 3-5% of stomach cancer cases are hereditary. We discussed several cancer  syndromes that increase the risk for stomach cancer, including HDGC. Hereditary diffuse gastric cancer (HDGC) is an inherited disorder that greatly increases an individual's chance of developing diffuse gastric cancer. The risk for gastric cancer by age 36 is estimated to be about 83% for women. Additionally we discussed Lynch syndrome, which can confer a 1-13% risk for gastric cancer, and Familial Adenomatous Polyposis (FAP) which confers a 1-2% risk for gastric cancer. Gastric cancer is also seen in other conditions such as Peutz-Jeghers syndrome and Juvenile Polyposis Syndrome.  We reviewed the characteristics, features and inheritance patterns of  hereditary cancer syndromes. We also discussed genetic testing, including the appropriate family members to test, the process of testing, insurance coverage and turn-around-time for results. We discussed the implications of a negative, positive and/or variant of uncertain significant result. We recommended Ms. Egger pursue genetic testing for the [facility] Multi-Cancer gene panel.   The Multi-Cancer Panel offered by [facility] includes sequencing and/or deletion duplication testing of the following 84 genes: AIP, ALK, APC, ATM, AXIN2,BAP1,  BARD1, BLM, BMPR1A, BRCA1, BRCA2, BRIP1, CASR, CDC73, CDH1, CDK4, CDKN1B, CDKN1C, CDKN2A (p14ARF), CDKN2A (p16INK4a), CEBPA, CHEK2, CTNNA1, DICER1, DIS3L2, EGFR (c.2369C>T, p.Thr790Met variant only), EPCAM (Deletion/duplication testing only), FH, FLCN, GATA2, GPC3, GREM1 (Promoter region deletion/duplication testing only), HOXB13 (c.251G>A, p.Gly84Glu), HRAS, KIT, MAX, MEN1, MET, MITF (c.952G>A, p.Glu318Lys variant only), MLH1, MSH2, MSH3, MSH6, MUTYH, NBN, NF1, NF2, NTHL1, PALB2, PDGFRA, PHOX2B, PMS2, POLD1, POLE, POT1, PRKAR1A, PTCH1, PTEN, RAD50, RAD51C, RAD51D, RB1, RECQL4, RET, RUNX1, SDHAF2, SDHA (sequence changes only), SDHB, SDHC, SDHD, SMAD4, SMARCA4, SMARCB1, SMARCE1, STK11, SUFU, TERC, TERT, TMEM127, TP53, TSC1, TSC2, VHL,  WRN and WT1.   Based on Ms. Diego's family history of stomach cancer, she meets NCCN criteria for genetic testing (based on the NCCN Guidelines Version 3.2019 Gastric Cancer.)  Despite that she meets criteria, she may still have an out of pocket cost. The lab will notify her of an OOP if any.  PLAN: After considering the risks, benefits, and limitations, Ms. Sorci  provided informed consent to pursue genetic testing and the blood sample was sent to [facility] for analysis of the Multi-Cancer Panel. Results should be available within approximately 2-3 weeks' time, at which point they will be disclosed by telephone to Ms. Bruington, as will any additional recommendations warranted by these results. Ms. Zetino will receive a summary of her genetic counseling visit and a copy of her results once available. This information will also be available in Epic. We encouraged Ms. Fenster to remain in contact with cancer genetics annually so that we can continuously update the family history and inform her of any changes in cancer genetics and testing that may be of benefit for her family. Ms. Jamison's questions were answered to her satisfaction today. Our contact information was provided should additional questions or concerns arise.  Based on Ms. Styles's family history, we recommended her siblings and maternal relatives  have genetic counseling and testing. Ms. Louth will let us know if we can be of any assistance in coordinating genetic counseling and/or testing for these family members.  Lastly, we encouraged Ms. Shurley to remain in contact with cancer genetics annually so that we can continuously update the family history and inform her of any changes in cancer genetics and testing that may be of benefit for this family.   Ms.  Blissett's questions were answered to her satisfaction today. Our contact information was provided should additional questions or concerns arise. Thank you for the referral and  allowing [place] to share in the care of your patient.   Faith Rogue, MS Genetic Counselor [email or web address] Phone: [phone]  The patient was seen for a total of 40 minutes in face-to-face genetic counseling.

## 2018-03-24 ENCOUNTER — Other Ambulatory Visit: Payer: Self-pay

## 2018-03-24 ENCOUNTER — Ambulatory Visit: Payer: 59 | Admitting: Physician Assistant

## 2018-03-24 ENCOUNTER — Encounter: Payer: Self-pay | Admitting: Physician Assistant

## 2018-03-24 ENCOUNTER — Telehealth: Payer: Self-pay | Admitting: Licensed Clinical Social Worker

## 2018-03-24 VITALS — BP 102/80 | HR 85 | Temp 98.3°F | Resp 14 | Ht 62.0 in | Wt 174.0 lb

## 2018-03-24 DIAGNOSIS — B9789 Other viral agents as the cause of diseases classified elsewhere: Secondary | ICD-10-CM | POA: Diagnosis not present

## 2018-03-24 DIAGNOSIS — J069 Acute upper respiratory infection, unspecified: Secondary | ICD-10-CM | POA: Diagnosis not present

## 2018-03-24 MED ORDER — BENZONATATE 100 MG PO CAPS: 100.0000 mg | ORAL_CAPSULE | Freq: Three times a day (TID) | ORAL | 0 refills | Status: DC | PRN

## 2018-03-24 NOTE — Patient Instructions (Signed)
Please keep well-hydrated and get plenty of rest. Take Mucinex-DM to help with cough and congestion. Take the prescription cough medication at night.   Follow-up if symptoms are not easing off in 48-72 hours or if anything worsens.

## 2018-03-24 NOTE — Progress Notes (Signed)
Patient presents to clinic today c/o 2 days of chest congestion and cough that is wet but hard to get things out. Notes some mild chest tightness. Denies chest pain. Denies ear pain, sinus pressure or sinus pain. Son and mother-in-law have been sick with similar symptoms.   Past Medical History:  Diagnosis Date  . Family history of liver cancer   . Family history of prostate cancer   . Family history of stomach cancer   . GERD (gastroesophageal reflux disease)   . Headache(784.0)    otc meds prn  . 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 Kidney stones     x 1  h/o  . Postpartum care following cesarean delivery (3/15) 07/05/2014  . Sciatica of left side    in past  . Ulcer    clinical dx; no Endoscopy    Current Outpatient Medications on File Prior to Visit  Medication Sig Dispense Refill  . fexofenadine (ALLEGRA) 180 MG tablet Take 180 mg by mouth daily.    . fluticasone (FLONASE) 50 MCG/ACT nasal spray Place 2 sprays into both nostrils daily. 16 g 6  . Elderberry 575 MG/5ML SYRP Take 5 mLs by mouth daily as needed (cold).     No current facility-administered medications on file prior to visit.     No Known Allergies  Family History  Problem Relation Age of Onset  . Alcohol abuse Father   . Cancer Father        liver, d.54  . Alcohol abuse Mother   . Sudden death Mother        bleeding internal and could not do surgery because of stomach cancer  . Stomach cancer Mother [id number]  . Alcohol abuse Maternal Grandmother   . Hyperlipidemia Maternal Grandmother   . Hypertension Maternal Grandmother   . Diabetes Maternal Grandmother   . Renal Disease Maternal Grandmother   . Heart disease Paternal Grandmother   . Prostate cancer Maternal Uncle   . Cancer Paternal Uncle        unk type    Social History   Socioeconomic History  . Marital status: Married    Spouse name: Not on file  . Number of children: Not on file  . Years of  education: Not on file  . Highest education level: Not on file  Occupational History  . Not on file  Social Needs  . Financial resource strain: Not on file  . Food insecurity:    Worry: Not on file    Inability: Not on file  . Transportation needs:    Medical: Not on file    Non-medical: Not on file  Tobacco Use  . Smoking status: Never Smoker  . Smokeless tobacco: Never Used  Substance and Sexual Activity  . Alcohol use: Yes    Comment: social  . Drug use: No  . Sexual activity: Yes    Birth control/protection: None  Lifestyle  . Physical activity:    Days per week: Not on file    Minutes per session: Not on file  . Stress: Not on file  Relationships  . Social connections:    Talks on phone: Not on file    Gets together: Not on file    Attends religious service: Not on file    Active member of club or organization: Not on file    Attends meetings of clubs or organizations: Not on file    Relationship status: Not on file  Other Topics Concern  . Not on file  Social History Narrative  . Not on file   Review of Systems - See HPI.  All other ROS are negative.  BP 102/80   Pulse 85   Temp 98.3 F (36.8 C) (Oral)   Resp 14   Ht 5\' 2"  (1.575 m)   Wt 174 lb (78.9 kg)   SpO2 98%   BMI 31.83 kg/m   Physical Exam  Constitutional: She is oriented to person, place, and time. She appears well-developed and well-nourished.  HENT:  Head: Normocephalic and atraumatic.  Right Ear: External ear normal.  Left Ear: External ear normal.  Nose: Nose normal.  Mouth/Throat: Oropharynx is clear and moist.  Neck: Neck supple.  Cardiovascular: Normal rate, regular rhythm, normal heart sounds and intact distal pulses.  Pulmonary/Chest: Effort normal and breath sounds normal. No stridor. No respiratory distress. She has no wheezes. She has no rales. She exhibits no tenderness.  Neurological: She is alert and oriented to person, place, and time.  Psychiatric: She has a normal mood  and affect.  Vitals reviewed.   Recent Results (from the past 2160 hour(s))  Lipid panel     Status: Abnormal   Collection Time: 02/04/18  8:30 AM  Result Value Ref Range   Cholesterol 160 0 - 200 mg/dL    Comment: ATP III Classification       Desirable:  < 200 mg/dL               Borderline High:  200 - 239 mg/dL          High:  > = 161 mg/dL   Triglycerides 096.0 (H) 0.0 - 149.0 mg/dL    Comment: Normal:  <454 mg/dLBorderline High:  150 - 199 mg/dL   HDL 09.81 (L) >19.14 mg/dL   VLDL 78.2 0.0 - 95.6 mg/dL   LDL Cholesterol 91 0 - 99 mg/dL   Total CHOL/HDL Ratio 4     Comment:                Men          Women1/2 Average Risk     3.4          3.3Average Risk          5.0          4.42X Average Risk          9.6          7.13X Average Risk          15.0          11.0                       NonHDL 123.61     Comment: NOTE:  Non-HDL goal should be 30 mg/dL higher than patient's LDL goal (i.e. LDL goal of < 70 mg/dL, would have non-HDL goal of < 100 mg/dL)  Basic metabolic panel     Status: None   Collection Time: 02/04/18  8:30 AM  Result Value Ref Range   Sodium 139 135 - 145 mEq/L   Potassium 3.9 3.5 - 5.1 mEq/L   Chloride 107 96 - 112 mEq/L   CO2 26 19 - 32 mEq/L   Glucose, Bld 92 70 - 99 mg/dL   BUN 15 6 - 23 mg/dL   Creatinine, Ser 2.13 0.40 - 1.20 mg/dL   Calcium 9.0 8.4 - 08.6 mg/dL   GFR 57.84 >69.62 mL/min  TSH     Status: None   Collection Time: 02/04/18  8:30 AM  Result Value Ref Range   TSH 3.28 0.35 - 4.50 uIU/mL  Hepatic function panel     Status: Abnormal   Collection Time: 02/04/18  8:30 AM  Result Value Ref Range   Total Bilirubin 1.4 (H) 0.2 - 1.2 mg/dL   Bilirubin, Direct 0.2 0.0 - 0.3 mg/dL   Alkaline Phosphatase 45 39 - 117 U/L   AST 15 0 - 37 U/L   ALT 18 0 - 35 U/L   Total Protein 6.8 6.0 - 8.3 g/dL   Albumin 4.4 3.5 - 5.2 g/dL  CBC with Differential/Platelet     Status: None   Collection Time: 02/04/18  8:30 AM  Result Value Ref Range   WBC 4.5 4.0  - 10.5 K/uL   RBC 4.29 3.87 - 5.11 Mil/uL   Hemoglobin 13.6 12.0 - 15.0 g/dL   HCT 40.9 81.1 - 91.4 %   MCV 93.3 78.0 - 100.0 fl   MCHC 34.1 30.0 - 36.0 g/dL   RDW 78.2 95.6 - 21.3 %   Platelets 152.0 150.0 - 400.0 K/uL   Neutrophils Relative % 57.2 43.0 - 77.0 %   Lymphocytes Relative 30.2 12.0 - 46.0 %   Monocytes Relative 6.6 3.0 - 12.0 %   Eosinophils Relative 4.8 0.0 - 5.0 %   Basophils Relative 1.2 0.0 - 3.0 %   Neutro Abs 2.6 1.4 - 7.7 K/uL   Lymphs Abs 1.4 0.7 - 4.0 K/uL   Monocytes Absolute 0.3 0.1 - 1.0 K/uL   Eosinophils Absolute 0.2 0.0 - 0.7 K/uL   Basophils Absolute 0.1 0.0 - 0.1 K/uL  VITAMIN D 25 Hydroxy (Vit-D Deficiency, Fractures)     Status: Abnormal   Collection Time: 02/04/18  8:30 AM  Result Value Ref Range   VITD 29.20 (L) 30.00 - 100.00 ng/mL    Assessment/Plan: 1. Viral URI with cough Lungs CTAB. Exam overall unremarkable. Supportive measures and OTC medications reviewed. No indication for ABX at present time. Rx promethazine-DM.Marland Kitchen   Piedad Climes, PA-C

## 2018-03-25 ENCOUNTER — Encounter: Payer: Self-pay | Admitting: Licensed Clinical Social Worker

## 2018-03-25 ENCOUNTER — Ambulatory Visit: Payer: Self-pay | Admitting: Licensed Clinical Social Worker

## 2018-03-25 DIAGNOSIS — Z1379 Encounter for other screening for genetic and chromosomal anomalies: Secondary | ICD-10-CM

## 2018-03-25 NOTE — Telephone Encounter (Signed)
Revealed negative genetic testing. This normal result is resassuring.  It is unlikely that there is an increased risk of cancer due to a mutation in one of these genes.  However, genetic testing is not perfect, and cannot definitively rule out a hereditary cause.  It will be important for her to keep in contact with genetics to learn if any additional testing may be needed in the future.  We also discussed that her brother and sister could have genetic counseling and testing as well.

## 2018-03-25 NOTE — Progress Notes (Signed)
HPI:  Rachael Chandler was previously seen in the [facility] clinic on 03/13/2018 due to a family history of stomach cancer and concerns regarding a hereditary predisposition to cancer. Please refer to our prior cancer genetics clinic note for more information regarding Rachael Chandler medical, social and family histories, and our assessment and recommendations, at the time. Rachael Chandler recent genetic test results were disclosed to her, as well as recommendations warranted by these results. These results and recommendations are discussed in more detail below.   FAMILY HISTORY:  We obtained a detailed, 4-generation family history.  Significant diagnoses are listed below: Family History  Problem Relation Age of Onset  . Alcohol abuse Father   . Cancer Father        liver, d.54  . Alcohol abuse Mother   . Sudden death Mother        bleeding internal and could not do surgery because of stomach cancer  . Stomach cancer Mother 64  . Alcohol abuse Maternal Grandmother   . Hyperlipidemia Maternal Grandmother   . Hypertension Maternal Grandmother   . Diabetes Maternal Grandmother   . Renal Disease Maternal Grandmother   . Heart disease Paternal Grandmother   . Prostate cancer Maternal Uncle   . Cancer Paternal Uncle        unk type   Rachael Chandler was raised by her half-brothers grandparents, and therefore has fairly limited information about her biological family. She has one son, Rachael Chandler who is 59 years old. She has a half-brother through her mom named Rachael Chandler, 63. She has a half-sister through her mom, Rachael Chandler, 63. She has two nephews.   Rachael Chandler mother was diagnosed with stomach cancer at the age of 53 and died at 90 due to internal bleeding. Her mom had a full brother and full sister. The sister died in her 45s, possibly of cancer. The brother is living. Rachael Chandler mother also had a half brother through her mom who had prostate cancer. No cancer in Rachael Chandler cousins that she is aware  of. Her maternal grandmother died in her late 35's, no information about her grandfather.  Rachael Chandler father was diagnosed with liver cancer at 52 and died at 80. He was adopted by one of his aunts but the patient does not know which one or which side of the family. He did have three sisters and two brothers. One of his brothers died young from cancer, unknown type. The patient's paternal grandmother died in her 65s, no information about paternal grandfather.   Rachael Chandler is unaware of previous family history of genetic testing for hereditary cancer risks. Patient's ancestors are of Northern European/Western European descent. There is no reported Ashkenazi Jewish ancestry. There is no known consanguinity.   GENETIC TEST RESULTS: Genetic testing performed through [facility]'s Multi-Cancer Panel reported out on 03/18/2018 showed no pathogenic mutations. The Multi-Cancer Panel offered by [facility] includes sequencing and/or deletion duplication testing of the following 84 genes: AIP, ALK, APC, ATM, AXIN2,BAP1,  BARD1, BLM, BMPR1A, BRCA1, BRCA2, BRIP1, CASR, CDC73, CDH1, CDK4, CDKN1B, CDKN1C, CDKN2A (p14ARF), CDKN2A (p16INK4a), CEBPA, CHEK2, CTNNA1, DICER1, DIS3L2, EGFR (c.2369C>T, p.Thr790Met variant only), EPCAM (Deletion/duplication testing only), FH, FLCN, GATA2, GPC3, GREM1 (Promoter region deletion/duplication testing only), HOXB13 (c.251G>A, p.Gly84Glu), HRAS, KIT, MAX, MEN1, MET, MITF (c.952G>A, p.Glu318Lys variant only), MLH1, MSH2, MSH3, MSH6, MUTYH, NBN, NF1, NF2, NTHL1, PALB2, PDGFRA, PHOX2B, PMS2, POLD1, POLE, POT1, PRKAR1A, PTCH1, PTEN, RAD50, RAD51C, RAD51D, RB1, RECQL4, RET, RUNX1, SDHAF2, SDHA (sequence changes only), SDHB, SDHC,  SDHD, SMAD4, SMARCA4, SMARCB1, SMARCE1, STK11, SUFU, TERC, TERT, TMEM127, TP53, TSC1, TSC2, VHL, WRN and WT1.  The test report will be scanned into EPIC and will be located under the Molecular Pathology section of the Results Review tab. A portion of the result report is  included below for reference.    We discussed with Rachael Chandler that because current genetic testing is not perfect, it is possible there may be a gene mutation in one of these genes that current testing cannot detect, but that chance is small.  We also discussed, that there could be another gene that has not yet been discovered, or that we have not yet tested, that is responsible for the cancer diagnoses in the family. It is also possible there is a hereditary cause for the cancer in the family that Rachael Chandler did not inherit and therefore was not identified in her testing.  Therefore, it is important to remain in touch with cancer genetics in the future so that we can continue to offer Rachael Chandler the most up to date genetic testing.   ADDITIONAL GENETIC TESTING: We discussed with Rachael Chandler that her genetic testing was fairly extensive.  If there are are genes identified to increase cancer risk that can be analyzed in the future, we would be happy to discuss and coordinate this testing at that time.    CANCER SCREENING RECOMMENDATIONS: Rachael Chandler test result is considered negative (normal).  This means that we have not identified a hereditary cause for her family history of cancer at this time.   This normal indicates that it is unlikely Rachael Chandler has an increased risk of cancer due to a mutation in one of these genes. While reassuring, this does not definitively rule out a hereditary predisposition to cancer. It is still possible that there could be genetic mutations that are undetectable by current technology, or genetic mutations in genes that have not been tested or identified to increase cancer risk.  Therefore, it is recommended she continue to follow the cancer management and screening guidelines provided by her oncology and primary healthcare provider. An individual's cancer risk is not determined by genetic test results alone.  Overall cancer risk assessment includes additional factors such as  personal medical history, family history, etc.  These should be used to make a personalized plan for cancer prevention and surveillance.    RECOMMENDATIONS FOR FAMILY MEMBERS:  Relatives in this family might be at some increased risk of developing cancer, over the general population risk, simply due to the family history of cancer.  We recommended women in this family have a yearly mammogram beginning at age 71, or 8 years younger than the earliest onset of cancer, an annual clinical breast exam, and perform monthly breast self-exams. Women in this family should also have a gynecological exam as recommended by their primary provider. All family members should have a colonoscopy as directed by their doctors.  All family members should inform their physicians about the family history of cancer so their doctors can make the most appropriate screening recommendations for them.   It is also possible there is a hereditary cause for the cancer in Ms. Dragovich's family that she did not inherit and therefore was not identified in her.  We recommended her brother, sister, and maternal relatives have genetic counseling and testing. Ms. Coller will let us know if we can be of any assistance in coordinating genetic counseling and/or testing for these family members.   FOLLOW-UP: Lastly, we  discussed with Ms. Droege that cancer genetics is a rapidly advancing field and it is possible that new genetic tests will be appropriate for her and/or her family members in the future. We encouraged her to remain in contact with cancer genetics on an annual basis so we can update her personal and family histories and let her know of advances in cancer genetics that may benefit this family.   Our contact number was provided. Ms. Wilmore's questions were answered to her satisfaction, and she knows she is welcome to call us at anytime with additional questions or concerns.  Faith Rogue, MS Genetic  Counselor [email or web address] Phone: [phone]

## 2018-05-18 ENCOUNTER — Encounter: Payer: Self-pay | Admitting: Family Medicine

## 2018-05-18 ENCOUNTER — Ambulatory Visit: Payer: 59 | Admitting: Family Medicine

## 2018-05-18 ENCOUNTER — Other Ambulatory Visit: Payer: Self-pay

## 2018-05-18 VITALS — BP 123/78 | HR 87 | Temp 99.7°F | Resp 16 | Ht 62.0 in | Wt 172.2 lb

## 2018-05-18 DIAGNOSIS — M7062 Trochanteric bursitis, left hip: Secondary | ICD-10-CM

## 2018-05-18 DIAGNOSIS — R6889 Other general symptoms and signs: Secondary | ICD-10-CM | POA: Diagnosis not present

## 2018-05-18 LAB — POCT INFLUENZA A/B
INFLUENZA B, POC: NEGATIVE
Influenza A, POC: POSITIVE — AB

## 2018-05-18 MED ORDER — IBUPROFEN 800 MG PO TABS: 800.0000 mg | ORAL_TABLET | Freq: Three times a day (TID) | ORAL | 0 refills | Status: DC | PRN

## 2018-05-18 MED ORDER — OSELTAMIVIR PHOSPHATE 75 MG PO CAPS: 75.0000 mg | ORAL_CAPSULE | Freq: Two times a day (BID) | ORAL | 0 refills | Status: DC

## 2018-05-18 NOTE — Progress Notes (Signed)
   Subjective:    Patient ID: Rachael Chandler, female    DOB: 1981/01/08, 38 y.o.   MRN: [id number]  HPI Flu like sxs- 'really really achy'.  sxs started Friday.  Recently traveled to [place].  + fatigue, HA.  No known fever.  No ear pain.  No sore throat.  Minimal cough.  Denies sensitive skin.  No known sick contacts.  Denies facial pain/pressure.  + nasal congestion.  L lateral hip pain- sxs started 2 months ago, occur when lying on L side   Review of Systems For ROS see HPI     Objective:   Physical Exam Vitals signs reviewed.  Constitutional:      General: She is not in acute distress.    Appearance: She is well-developed.     Comments: Obviously not feeling well  HENT:     Head: Normocephalic and atraumatic.     Right Ear: Tympanic membrane normal.     Left Ear: Tympanic membrane normal.     Nose: Mucosal edema and rhinorrhea present.     Right Sinus: No maxillary sinus tenderness or frontal sinus tenderness.     Left Sinus: No maxillary sinus tenderness or frontal sinus tenderness.     Mouth/Throat:     Pharynx: Uvula midline. Posterior oropharyngeal erythema present. No oropharyngeal exudate.  Eyes:     Conjunctiva/sclera: Conjunctivae normal.     Pupils: Pupils are equal, round, and reactive to light.  Neck:     Musculoskeletal: Normal range of motion and neck supple.  Cardiovascular:     Rate and Rhythm: Normal rate and regular rhythm.     Heart sounds: Normal heart sounds.  Pulmonary:     Effort: Pulmonary effort is normal. No respiratory distress.     Breath sounds: Normal breath sounds. No wheezing.  Musculoskeletal:     Left hip: She exhibits tenderness (TTP over L trochanteric bursa).  Lymphadenopathy:     Cervical: No cervical adenopathy.  Skin:    General: Skin is warm.           Assessment & Plan:  Flu- New.  absence of bacterial infxn and sxs are consistent w/ flu.  Rapid test confirms.  Start Tamiflu.  Reviewed supportive care and red flags that  should prompt return.  Pt expressed understanding and is in agreement w/ plan.   Trochanteric Bursitis- new.  Pt's sxs are consistent w/ dx.  Start Ibuprofen.  Ice prn.  Reviewed supportive care and red flags that should prompt return.  Pt expressed understanding and is in agreement w/ plan.

## 2018-05-18 NOTE — Patient Instructions (Addendum)
Follow up as needed or as scheduled You have the flu.  Start Tamiflu twice daily x5 days Drink plenty of fluids REST! I sent prescription Ibuprofen that you can take 3x/day that will improve body aches and the hip bursitis Ice the hip as needed Call with any questions or concerns Hang in there!

## 2018-06-03 ENCOUNTER — Ambulatory Visit: Payer: 59 | Admitting: Family Medicine

## 2018-06-03 ENCOUNTER — Ambulatory Visit
Admission: RE | Admit: 2018-06-03 | Discharge: 2018-06-03 | Disposition: A | Discharge status: Home / Self Care | Payer: 59 | Source: Ambulatory Visit | Attending: Family Medicine | Admitting: Family Medicine

## 2018-06-03 ENCOUNTER — Telehealth: Payer: Self-pay | Admitting: Family Medicine

## 2018-06-03 ENCOUNTER — Other Ambulatory Visit: Payer: Self-pay

## 2018-06-03 ENCOUNTER — Encounter: Payer: Self-pay | Admitting: Family Medicine

## 2018-06-03 VITALS — BP 122/76 | HR 81 | Temp 98.3°F | Resp 16 | Ht 62.0 in | Wt 171.1 lb

## 2018-06-03 DIAGNOSIS — R1033 Periumbilical pain: Secondary | ICD-10-CM | POA: Diagnosis not present

## 2018-06-03 DIAGNOSIS — R6889 Other general symptoms and signs: Secondary | ICD-10-CM | POA: Diagnosis not present

## 2018-06-03 LAB — BASIC METABOLIC PANEL
BUN: 12 mg/dL (ref 6–23)
CALCIUM: 9 mg/dL (ref 8.4–10.5)
CO2: 29 mEq/L (ref 19–32)
Chloride: 99 mEq/L (ref 96–112)
Creatinine, Ser: 0.8 mg/dL (ref 0.40–1.20)
GFR: 80.54 mL/min (ref >60.00)
Glucose, Bld: 85 mg/dL (ref 70–99)
POTASSIUM: 3.4 meq/L — AB (ref 3.5–5.1)
Sodium: 135 mEq/L (ref 135–145)

## 2018-06-03 LAB — CBC WITH DIFFERENTIAL/PLATELET
BASOS PCT: 0.5 % (ref 0.0–3.0)
Basophils Absolute: 0 10*3/uL (ref 0.0–0.1)
EOS ABS: 0 10*3/uL (ref 0.0–0.7)
Eosinophils Relative: 0.3 % (ref 0.0–5.0)
HCT: 40.8 % (ref 36.0–46.0)
Hemoglobin: 14 g/dL (ref 12.0–15.0)
LYMPHS ABS: 0.9 10*3/uL (ref 0.7–4.0)
Lymphocytes Relative: 17.6 % (ref 12.0–46.0)
MCHC: 34.2 g/dL (ref 30.0–36.0)
MCV: 90.3 fl (ref 78.0–100.0)
MONO ABS: 0.5 10*3/uL (ref 0.1–1.0)
Monocytes Relative: 9.6 % (ref 3.0–12.0)
NEUTROS PCT: 72 % (ref 43.0–77.0)
Neutro Abs: 3.6 10*3/uL (ref 1.4–7.7)
PLATELETS: 108 10*3/uL — AB (ref 150.0–400.0)
RBC: 4.52 Mil/uL (ref 3.87–5.11)
RDW: 12.6 % (ref 11.5–15.5)
WBC: 5 10*3/uL (ref 4.0–10.5)

## 2018-06-03 LAB — HEPATIC FUNCTION PANEL
ALT: 18 U/L (ref 0–35)
AST: 18 U/L (ref 0–37)
Albumin: 4.4 g/dL (ref 3.5–5.2)
Alkaline Phosphatase: 47 U/L (ref 39–117)
BILIRUBIN TOTAL: 3 mg/dL — AB (ref 0.2–1.2)
Bilirubin, Direct: 0.4 mg/dL — ABNORMAL HIGH (ref 0.0–0.3)
Total Protein: 7 g/dL (ref 6.0–8.3)

## 2018-06-03 LAB — POCT INFLUENZA A/B
Influenza A, POC: NEGATIVE
Influenza B, POC: NEGATIVE

## 2018-06-03 LAB — POCT URINE PREGNANCY: Preg Test, Ur: NEGATIVE

## 2018-06-03 MED ORDER — IOPAMIDOL (ISOVUE-300) INJECTION 61%
100.0000 mL | Freq: Once | INTRAVENOUS | Status: AC | PRN
  Administered 2018-06-03: 100 mL via INTRAVENOUS

## 2018-06-03 NOTE — Patient Instructions (Signed)
We'll notify you of your lab and imaging results as soon as they are available If your pain significantly changes or worsens before your CT scan- please go to the ER Call with any questions or concerns Hang in there!

## 2018-06-03 NOTE — Telephone Encounter (Signed)
CT abdomen/pelvis-results available in epic at this time.

## 2018-06-03 NOTE — Telephone Encounter (Signed)
Already reviewed and pt notified

## 2018-06-03 NOTE — Progress Notes (Signed)
   Subjective:    Patient ID: Rachael Chandler, female    DOB: 04-21-1981, 38 y.o.   MRN: [id number]  HPI abd pain- had diarrhea Monday night, some this AM.  No nausea, vomiting.  + abd pain- uncomfortable to sit.  Pain is central under umbilicus and radiates to back.  Not painful to walk.  + anorexia.  Still has appendix.  Pain started suddenly this AM  'i'm afraid I have flu again'- pt reports body aches, chills, low grade fever.  Started suddenly yesterday.  + HA.  Denies facial pain, ear pain.   Review of Systems For ROS see HPI     Objective:   Physical Exam Vitals signs reviewed.  Constitutional:      Appearance: She is well-developed.     Comments: Uncomfortable appearing  HENT:     Head: Normocephalic and atraumatic.     Comments: TMs WNL bilaterally No TTP over sinuses    Mouth/Throat:     Mouth: Mucous membranes are moist.     Pharynx: Oropharynx is clear. No oropharyngeal exudate.  Cardiovascular:     Rate and Rhythm: Normal rate and regular rhythm.     Heart sounds: No murmur.  Pulmonary:     Effort: Pulmonary effort is normal.     Breath sounds: Normal breath sounds. No wheezing or rhonchi.  Abdominal:     General: Bowel sounds are normal. There is no distension.     Palpations: Abdomen is soft.     Tenderness: There is abdominal tenderness in the right lower quadrant and periumbilical area. There is no guarding or rebound. Positive signs include McBurney's sign and psoas sign.  Neurological:     Mental Status: She is alert.           Assessment & Plan:  Acute abdominal pain- new.  Pt is typically very stoic and does not complain, so for her to indicate she is having 'bad' abdominal pain is concerning.  Pain is just under umbilicus and into RLQ.  + psoas sign.  + anorexia.  + diarrhea.  There are enough concerns to warrant abd imaging to assess for possible appendicitis.  Will also get CBC, CMP, Upreg (-).  If CT is +, will refer to ER for admission and  surgical tx.  If negative, will treat as viral illness unless other sxs emerge.  Pt expressed understanding and is in agreement w/ plan.

## 2018-10-02 ENCOUNTER — Encounter (HOSPITAL_COMMUNITY): Payer: Self-pay

## 2018-10-02 ENCOUNTER — Emergency Department (HOSPITAL_COMMUNITY)
Admission: EM | Admit: 2018-10-02 | Discharge: 2018-10-02 | Disposition: A | Discharge status: Home / Self Care | Payer: 59 | Attending: Emergency Medicine | Admitting: Emergency Medicine

## 2018-10-02 ENCOUNTER — Other Ambulatory Visit: Payer: Self-pay

## 2018-10-02 ENCOUNTER — Emergency Department (HOSPITAL_COMMUNITY): Payer: 59

## 2018-10-02 DIAGNOSIS — R1011 Right upper quadrant pain: Secondary | ICD-10-CM | POA: Diagnosis present

## 2018-10-02 DIAGNOSIS — Z3202 Encounter for pregnancy test, result negative: Secondary | ICD-10-CM | POA: Insufficient documentation

## 2018-10-02 DIAGNOSIS — K8020 Calculus of gallbladder without cholecystitis without obstruction: Secondary | ICD-10-CM | POA: Diagnosis not present

## 2018-10-02 LAB — CBC WITH DIFFERENTIAL/PLATELET
Abs Immature Granulocytes: 0.04 10*3/uL (ref 0.00–0.07)
Basophils Absolute: 0.1 10*3/uL (ref 0.0–0.1)
Basophils Relative: 1 %
Eosinophils Absolute: 0.4 10*3/uL (ref 0.0–0.5)
Eosinophils Relative: 5 %
HCT: 38.8 % (ref 36.0–46.0)
Hemoglobin: 13 g/dL (ref 12.0–15.0)
Immature Granulocytes: 1 %
Lymphocytes Relative: 27 %
Lymphs Abs: 2 10*3/uL (ref 0.7–4.0)
MCH: 30.7 pg (ref 26.0–34.0)
MCHC: 33.5 g/dL (ref 30.0–36.0)
MCV: 91.7 fL (ref 80.0–100.0)
Monocytes Absolute: 0.6 10*3/uL (ref 0.1–1.0)
Monocytes Relative: 8 %
Neutro Abs: 4.2 10*3/uL (ref 1.7–7.7)
Neutrophils Relative %: 58 %
Platelets: 148 10*3/uL — ABNORMAL LOW (ref 150–400)
RBC: 4.23 MIL/uL (ref 3.87–5.11)
RDW: 13.3 % (ref 11.5–15.5)
WBC: 7.2 10*3/uL (ref 4.0–10.5)
nRBC: 0 % (ref 0.0–0.2)

## 2018-10-02 LAB — URINALYSIS, ROUTINE W REFLEX MICROSCOPIC
Bacteria, UA: NONE SEEN
Bilirubin Urine: NEGATIVE
Glucose, UA: NEGATIVE mg/dL
Ketones, ur: NEGATIVE mg/dL
Leukocytes,Ua: NEGATIVE
Nitrite: NEGATIVE
Protein, ur: NEGATIVE mg/dL
Specific Gravity, Urine: 1.023 (ref 1.005–1.030)
pH: 7 (ref 5.0–8.0)

## 2018-10-02 LAB — COMPREHENSIVE METABOLIC PANEL
ALT: 24 U/L (ref 0–44)
AST: 22 U/L (ref 15–41)
Albumin: 4.3 g/dL (ref 3.5–5.0)
Alkaline Phosphatase: 52 U/L (ref 38–126)
Anion gap: 8 (ref 5–15)
BUN: 15 mg/dL (ref 6–20)
CO2: 22 mmol/L (ref 22–32)
Calcium: 9.3 mg/dL (ref 8.9–10.3)
Chloride: 107 mmol/L (ref 98–111)
Creatinine, Ser: 0.74 mg/dL (ref 0.44–1.00)
GFR calc Af Amer: >60 mL/min (ref >60)
GFR calc non Af Amer: >60 mL/min (ref >60)
Glucose, Bld: 103 mg/dL — ABNORMAL HIGH (ref 70–99)
Potassium: 3.7 mmol/L (ref 3.5–5.1)
Sodium: 137 mmol/L (ref 135–145)
Total Bilirubin: 1.3 mg/dL — ABNORMAL HIGH (ref 0.3–1.2)
Total Protein: 7.3 g/dL (ref 6.5–8.1)

## 2018-10-02 LAB — LIPASE, BLOOD: Lipase: 33 U/L (ref 11–51)

## 2018-10-02 LAB — POC URINE PREG, ED: Preg Test, Ur: NEGATIVE

## 2018-10-02 MED ORDER — KETOROLAC TROMETHAMINE 30 MG/ML IJ SOLN
15.0000 mg | Freq: Once | INTRAMUSCULAR | Status: AC
  Administered 2018-10-02: 15 mg via INTRAVENOUS
  Filled 2018-10-02: qty 1

## 2018-10-02 MED ORDER — FAMOTIDINE 20 MG PO TABS: 20.0000 mg | ORAL_TABLET | Freq: Two times a day (BID) | ORAL | 0 refills | Status: DC

## 2018-10-02 MED ORDER — HYDROMORPHONE HCL 1 MG/ML IJ SOLN
1.0000 mg | Freq: Once | INTRAMUSCULAR | Status: AC
  Administered 2018-10-02: 1 mg via INTRAVENOUS
  Filled 2018-10-02: qty 1

## 2018-10-02 MED ORDER — ONDANSETRON 8 MG PO TBDP
8.0000 mg | ORAL_TABLET | Freq: Once | ORAL | Status: AC
  Administered 2018-10-02: 8 mg via ORAL
  Filled 2018-10-02: qty 1

## 2018-10-02 MED ORDER — ONDANSETRON HCL 4 MG PO TABS: 4.0000 mg | ORAL_TABLET | Freq: Four times a day (QID) | ORAL | 0 refills | Status: DC | PRN

## 2018-10-02 MED ORDER — ONDANSETRON HCL 4 MG/2ML IJ SOLN
4.0000 mg | Freq: Once | INTRAMUSCULAR | Status: AC
  Administered 2018-10-02: 4 mg via INTRAVENOUS
  Filled 2018-10-02: qty 2

## 2018-10-02 MED ORDER — HYDROCODONE-ACETAMINOPHEN 5-325 MG PO TABS: 1.0000 | ORAL_TABLET | ORAL | 0 refills | Status: DC | PRN

## 2018-10-02 NOTE — ED Triage Notes (Signed)
Pt complains of abd pain that radiates to her back that started about 11pm last night

## 2018-10-02 NOTE — ED Provider Notes (Signed)
[facility] DEPT Provider Note   CSN: [id number] Arrival date & time: 10/02/18  0232    History   Chief Complaint No chief complaint on file.   HPI Evoleth Nordmeyer is a 38 y.o. female.     Patient presents to the emergency department for evaluation of abdominal pain.  Patient reports that she had onset of severe pain in the right upper abdomen, radiating into her back around 11 PM.  Pain was persistent but is now easing off.  She had associated nausea but no vomiting.  She reports that she had a CT scan several months ago for abdominal pain and was told she had gallstones.  She has not had any persistent or recurrent pain since then, however.  She has not noticed any correlation to eating.  She has had frequent diarrhea.     Past Medical History:  Diagnosis Date  . Family history of liver cancer   . Family history of prostate cancer   . Family history of stomach cancer   . GERD (gastroesophageal reflux disease)   . Headache(784.0)    otc meds prn  . 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 Kidney stones     x 1  h/o  . Postpartum care following cesarean delivery (3/15) 07/05/2014  . Sciatica of left side    in past  . Ulcer    clinical dx; no Endoscopy    Patient Active Problem List   Diagnosis Date Noted  . Genetic testing 03/25/2018  . Family history of stomach cancer   . Family history of liver cancer   . Family history of prostate cancer   . Vitamin D deficiency 02/04/2018  . Allergic rhinitis 11/28/2017  . Breast lump 04/04/2017  . Physical exam 08/18/2015  . Chest pain 08/18/2015  . Epistaxis, recurrent 05/12/2014    Past Surgical History:  Procedure Laterality Date  . cautherization    . CESAREAN SECTION N/A 07/05/2014   Procedure: CESAREAN SECTION;  Surgeon: Brien Few, MD;  Location: [facility];  Service: Obstetrics;  Laterality: N/A;  . DILATATION & CURRETTAGE/HYSTEROSCOPY WITH  RESECTOCOPE  02/26/2012   Procedure: [place];  Surgeon: Delice Lesch, MD;  Location: [facility];  Service: Gynecology;  Laterality: N/A;     . fractured arm  1986   left  . WISDOM TOOTH EXTRACTION       OB History    Gravida  1   Para  1   Term  1   Preterm      AB      Living  1     SAB      TAB      Ectopic      Multiple  0   Live Births  1            Home Medications    Prior to Admission medications   Medication Sig Start Date End Date Taking? Authorizing Provider  calcium carbonate (TUMS - DOSED IN MG ELEMENTAL CALCIUM) 500 MG chewable tablet Chew 4 tablets by mouth 2 (two) times daily as needed for indigestion or heartburn.   Yes [provider]  famotidine (PEPCID) 20 MG tablet Take 1 tablet (20 mg total) by mouth 2 (two) times daily. 10/02/18   Orpah Greek, MD  fluticasone (FLONASE) 50 MCG/ACT nasal spray Place 2 sprays into both nostrils daily. Patient not taking: Reported on 05/18/2018 11/28/17   Midge Minium,  MD  HYDROcodone-acetaminophen (NORCO/VICODIN) 5-325 MG tablet Take 1 tablet by mouth every 4 (four) hours as needed for moderate pain. 10/02/18   Gilda Crease,  J, MD  ibuprofen (ADVIL,MOTRIN) 800 MG tablet Take 1 tablet (800 mg total) by mouth every 8 (eight) hours as needed. Patient not taking: Reported on 10/02/2018 05/18/18   Sheliah Hatchabori, Katherine E, MD  ondansetron [facility](ZOFRAN) 4 MG tablet Take 1 tablet (4 mg total) by mouth every 6 (six) hours as needed for nausea or vomiting. 10/02/18   , Canary Brimhristopher J, MD    Family History Family History  Problem Relation Age of Onset  . Alcohol abuse Father   . Cancer Father        liver, d.54  . Alcohol abuse Mother   . Sudden death Mother        bleeding internal and could not do surgery because of stomach cancer  . Stomach cancer Mother [id number]  . Alcohol abuse Maternal Grandmother   . Hyperlipidemia Maternal Grandmother   .  Hypertension Maternal Grandmother   . Diabetes Maternal Grandmother   . Renal Disease Maternal Grandmother   . Heart disease Paternal Grandmother   . Prostate cancer Maternal Uncle   . Cancer Paternal Uncle        unk type    Social History Social History   Tobacco Use  . Smoking status: Never Smoker  . Smokeless tobacco: Never Used  Substance Use Topics  . Alcohol use: Yes    Comment: social  . Drug use: No     Allergies   Patient has no known allergies.   Review of Systems Review of Systems  Gastrointestinal: Positive for abdominal pain and nausea.  All other systems reviewed and are negative.    Physical Exam Updated Vital Signs BP 125/78 (BP Location: Right Arm)   Pulse (!) 55   Temp 98.4 F (36.9 C) (Oral)   Resp 13   LMP 09/15/2018   SpO2 98%   Physical Exam Vitals signs and nursing note reviewed.  Constitutional:      General: She is not in acute distress.    Appearance: Normal appearance. She is well-developed.  HENT:     Head: Normocephalic and atraumatic.     Right Ear: Hearing normal.     Left Ear: Hearing normal.     Nose: Nose normal.  Eyes:     Conjunctiva/sclera: Conjunctivae normal.     Pupils: Pupils are equal, round, and reactive to light.  Neck:     Musculoskeletal: Normal range of motion and neck supple.  Cardiovascular:     Rate and Rhythm: Regular rhythm.     Heart sounds: S1 normal and S2 normal. No murmur. No friction rub. No gallop.   Pulmonary:     Effort: Pulmonary effort is normal. No respiratory distress.     Breath sounds: Normal breath sounds.  Chest:     Chest wall: No tenderness.  Abdominal:     General: Bowel sounds are normal.     Palpations: Abdomen is soft.     Tenderness: There is abdominal tenderness in the right upper quadrant. There is no guarding or rebound. Negative signs include Murphy's sign and McBurney's sign.     Hernia: No hernia is present.  Musculoskeletal: Normal range of motion.  Skin:     General: Skin is warm and dry.     Findings: No rash.  Neurological:     Mental Status: She is alert and oriented to person, place, and  time.     GCS: GCS eye subscore is 4. GCS verbal subscore is 5. GCS motor subscore is 6.     Cranial Nerves: No cranial nerve deficit.     Sensory: No sensory deficit.     Coordination: Coordination normal.  Psychiatric:        Speech: Speech normal.        Behavior: Behavior normal.        Thought Content: Thought content normal.      ED Treatments / Results  Labs (all labs ordered are listed, but only abnormal results are displayed) Labs Reviewed  CBC WITH DIFFERENTIAL/PLATELET - Abnormal; Notable for the following components:      Result Value   Platelets 148 (*)    All other components within normal limits  COMPREHENSIVE METABOLIC PANEL - Abnormal; Notable for the following components:   Glucose, Bld 103 (*)    Total Bilirubin 1.3 (*)    All other components within normal limits  URINALYSIS, ROUTINE W REFLEX MICROSCOPIC - Abnormal; Notable for the following components:   Hgb urine dipstick SMALL (*)    All other components within normal limits  LIPASE, BLOOD  POC URINE PREG, ED    EKG None  Radiology [place] Abdomen Limited Ruq  Result Date: 10/02/2018 CLINICAL DATA:  38 year old female with gallstones and right upper quadrant pain. EXAM: ULTRASOUND ABDOMEN LIMITED RIGHT UPPER QUADRANT COMPARISON:  CT Abdomen and Pelvis 06/03/2018 FINDINGS: Gallbladder: Multiple shadowing echogenic gallstones, individually estimated up to 19 millimeters diameter. Gallbladder wall thickness remains normal at 2 millimeters. No sonographic Murphy sign elicited. No pericholecystic fluid. Common bile duct: Diameter: 5 millimeters, normal. Liver: Echogenic liver (image 28). No discrete liver lesion. No intrahepatic biliary ductal dilatation. Portal vein is patent on color Doppler imaging with normal direction of blood flow towards the liver. Other findings: Negative  visible right kidney. IMPRESSION: 1. Cholelithiasis without evidence of acute cholecystitis. 2. Probable hepatic steatosis. No evidence of bile duct obstruction. Electronically Signed   By: Odessa FlemingH  Hall M.D.   On: 10/02/2018 03:52    Procedures Procedures (including critical care time)  Medications Ordered in ED Medications  ondansetron (ZOFRAN-ODT) disintegrating tablet 8 mg (8 mg Oral Given 10/02/18 0313)  ketorolac (TORADOL) 30 MG/ML injection 15 mg (15 mg Intravenous Given 10/02/18 0510)  ondansetron (ZOFRAN) injection 4 mg (4 mg Intravenous Given 10/02/18 0510)  HYDROmorphone (DILAUDID) injection 1 mg (1 mg Intravenous Given 10/02/18 0510)     Initial Impression / Assessment and Plan / ED Course  I have reviewed the triage vital signs and the nursing notes.  Pertinent labs & imaging results that were available during my care of the patient were reviewed by me and considered in my medical decision making (see chart for details).        Patient presents to the emergency department for evaluation of abdominal pain.  Patient had onset of moderate to severe right upper quadrant pain radiating into her back prior to coming to the ER.  At arrival the symptoms were easing off.  She has been told she has gallstones, seen on a previous CT.  Blood work was entirely normal.  While here she started to have increased nausea and pain, was administered Toradol, Dilaudid, Zofran with resolution.  Ultrasound does show numerous gallstones with normal gallbladder wall, no pericholecystic fluid, no Murphy sign.  Abdominal exam was very benign here in the ER as well.  Patient will be discharged, prompt follow-up with general surgery.  She was  given return precautions.  Final Clinical Impressions(s) / ED Diagnoses   Final diagnoses:  Calculus of gallbladder without cholecystitis without obstruction    ED Discharge Orders         Ordered    famotidine (PEPCID) 20 MG tablet  2 times daily,   Status:   Discontinued     10/02/18 0549    ondansetron (ZOFRAN) 4 MG tablet  Every 6 hours PRN     10/02/18 0549    HYDROcodone-acetaminophen (NORCO/VICODIN) 5-325 MG tablet  Every 4 hours PRN     10/02/18 0549    famotidine (PEPCID) 20 MG tablet  2 times daily     10/02/18 0550           Gilda CreasePollina,  J, MD 10/02/18 [phone]

## 2018-10-15 ENCOUNTER — Ambulatory Visit: Payer: Self-pay | Admitting: General Surgery

## 2018-11-16 ENCOUNTER — Ambulatory Visit: Payer: Self-pay | Admitting: General Surgery

## 2018-11-23 NOTE — Pre-Procedure Instructions (Signed)
[id number] - [place],  - [address]. AT [address]. [place] [address] Phone: [phone] Fax: [phone]      Your procedure is scheduled on 11-30-18  Report to [facility] Main Entrance "A" at 0900 A.M., and check in at the Admitting office.  Call this number if you have problems the morning of surgery:  [phone]  Call [phone] if you have any questions prior to your surgery date Monday-Friday 8am-4pm    Remember:  Do not eat or drink after midnight the night before your surgery  Take these medicines the morning of surgery with A SIP OF WATER ondansetron (ZOFRAN) acetaminophen (TYLENOL) as needed famotidine (PEPCID) as needed fexofenadine (ALLEGRA) as needed fluticasone (FLONASE)as needed. Bring your inhaler with you. HYDROcodone-acetaminophen (NORCO/VICODIN  7 days prior to surgery STOP taking any Aspirin (unless otherwise instructed by your surgeon), Aleve, Naproxen, Ibuprofen, Motrin, Advil, Goody's, BC's, all herbal medications, fish oil, and all vitamins.    The Morning of Surgery  Do not wear jewelry, make-up or nail polish.  Do not wear lotions, powders, or perfumes, or deodorant  Do not shave 48 hours prior to surgery.    Do not bring valuables to the hospital.  [facility] is not responsible for any belongings or valuables.  If you are a smoker, DO NOT Smoke 24 hours prior to surgery IF you wear a CPAP at night please bring your mask, tubing, and machine the morning of surgery   Remember that you must have someone to transport you home after your surgery, and remain with you for 24 hours if you are discharged the same day.   Contacts, glasses, hearing aids, dentures or bridgework may not be worn into surgery.    Leave your suitcase in the car.  After surgery it may be brought to your room.  For patients admitted to the hospital, discharge time will be determined by your treatment  team.  Patients discharged the day of surgery will not be allowed to drive home.    Special instructions:   [place]- Preparing For Surgery  Before surgery, you can play an important role. Because skin is not sterile, your skin needs to be as free of germs as possible. You can reduce the number of germs on your skin by washing with CHG (chlorahexidine gluconate) Soap before surgery.  CHG is an antiseptic cleaner which kills germs and bonds with the skin to continue killing germs even after washing.    Oral Hygiene is also important to reduce your risk of infection.  Remember - BRUSH YOUR TEETH THE MORNING OF SURGERY WITH YOUR REGULAR TOOTHPASTE  Please do not use if you have an allergy to CHG or antibacterial soaps. If your skin becomes reddened/irritated stop using the CHG.  Do not shave (including legs and underarms) for at least 48 hours prior to first CHG shower. It is OK to shave your face.  Please follow these instructions carefully.   1. Shower the NIGHT BEFORE SURGERY and the MORNING OF SURGERY with CHG Soap.   2. If you chose to wash your hair, wash your hair first as usual with your normal shampoo.  3. After you shampoo, rinse your hair and body thoroughly to remove the shampoo.  4. Use CHG as you would any other liquid soap. You can apply CHG directly to the skin and wash gently with a scrungie or a clean washcloth.   5. Apply the CHG Soap to your  body ONLY FROM THE NECK DOWN.  Do not use on open wounds or open sores. Avoid contact with your eyes, ears, mouth and genitals (private parts). Wash Face and genitals (private parts)  with your normal soap.   6. Wash thoroughly, paying special attention to the area where your surgery will be performed.  7. Thoroughly rinse your body with warm water from the neck down.  8. DO NOT shower/wash with your normal soap after using and rinsing off the CHG Soap.  9. Pat yourself dry with a CLEAN TOWEL.  10. Wear CLEAN PAJAMAS to bed  the night before surgery, wear comfortable clothes the morning of surgery  11. Place CLEAN SHEETS on your bed the night of your first shower and DO NOT SLEEP WITH PETS.    Day of Surgery:  Do not apply any deodorants/lotions. Please shower the morning of surgery with the CHG soap  Please wear clean clothes to the hospital/surgery center.   Remember to brush your teeth WITH YOUR REGULAR TOOTHPASTE.   Please read over the  fact sheets that you were given.

## 2018-11-24 ENCOUNTER — Other Ambulatory Visit: Payer: Self-pay

## 2018-11-24 ENCOUNTER — Encounter (HOSPITAL_COMMUNITY): Payer: Self-pay

## 2018-11-24 ENCOUNTER — Encounter (HOSPITAL_COMMUNITY)
Admission: RE | Admit: 2018-11-24 | Discharge: 2018-11-24 | Disposition: A | Discharge status: Home / Self Care | Payer: 59 | Source: Ambulatory Visit | Attending: General Surgery | Admitting: General Surgery

## 2018-11-24 DIAGNOSIS — Z20828 Contact with and (suspected) exposure to other viral communicable diseases: Secondary | ICD-10-CM | POA: Diagnosis not present

## 2018-11-24 DIAGNOSIS — Z01812 Encounter for preprocedural laboratory examination: Secondary | ICD-10-CM | POA: Insufficient documentation

## 2018-11-24 DIAGNOSIS — K8080 Other cholelithiasis without obstruction: Secondary | ICD-10-CM | POA: Diagnosis not present

## 2018-11-24 HISTORY — DX: Anxiety disorder, unspecified: F41.9

## 2018-11-24 LAB — BASIC METABOLIC PANEL
Anion gap: 10 (ref 5–15)
BUN: 11 mg/dL (ref 6–20)
CO2: 23 mmol/L (ref 22–32)
Calcium: 9.2 mg/dL (ref 8.9–10.3)
Chloride: 105 mmol/L (ref 98–111)
Creatinine, Ser: 0.85 mg/dL (ref 0.44–1.00)
GFR calc Af Amer: >60 mL/min (ref >60)
GFR calc non Af Amer: >60 mL/min (ref >60)
Glucose, Bld: 100 mg/dL — ABNORMAL HIGH (ref 70–99)
Potassium: 3.8 mmol/L (ref 3.5–5.1)
Sodium: 138 mmol/L (ref 135–145)

## 2018-11-24 LAB — CBC
HCT: 39.2 % (ref 36.0–46.0)
Hemoglobin: 12.6 g/dL (ref 12.0–15.0)
MCH: 30.1 pg (ref 26.0–34.0)
MCHC: 32.1 g/dL (ref 30.0–36.0)
MCV: 93.8 fL (ref 80.0–100.0)
Platelets: 165 10*3/uL (ref 150–400)
RBC: 4.18 MIL/uL (ref 3.87–5.11)
RDW: 13.2 % (ref 11.5–15.5)
WBC: 5.1 10*3/uL (ref 4.0–10.5)
nRBC: 0 % (ref 0.0–0.2)

## 2018-11-24 MED ORDER — CHLORHEXIDINE GLUCONATE CLOTH 2 % EX PADS: 6.0000 | MEDICATED_PAD | Freq: Once | CUTANEOUS | Status: DC

## 2018-11-26 ENCOUNTER — Other Ambulatory Visit (HOSPITAL_COMMUNITY)
Admission: RE | Admit: 2018-11-26 | Discharge: 2018-11-26 | Disposition: A | Discharge status: Home / Self Care | Payer: 59 | Source: Ambulatory Visit | Attending: General Surgery | Admitting: General Surgery

## 2018-11-26 DIAGNOSIS — Z01812 Encounter for preprocedural laboratory examination: Secondary | ICD-10-CM | POA: Diagnosis not present

## 2018-11-26 LAB — SARS CORONAVIRUS 2 (TAT 6-24 HRS): SARS Coronavirus 2: NEGATIVE

## 2018-11-30 ENCOUNTER — Ambulatory Visit (HOSPITAL_COMMUNITY)
Admission: RE | Admit: 2018-11-30 | Discharge: 2018-11-30 | Disposition: A | Discharge status: Home / Self Care | Payer: 59 | Attending: General Surgery | Admitting: General Surgery

## 2018-11-30 ENCOUNTER — Other Ambulatory Visit: Payer: Self-pay

## 2018-11-30 ENCOUNTER — Encounter (HOSPITAL_COMMUNITY)
Admission: RE | Disposition: A | Discharge status: Home / Self Care | Payer: Self-pay | Source: Home / Self Care | Attending: General Surgery

## 2018-11-30 ENCOUNTER — Ambulatory Visit (HOSPITAL_COMMUNITY): Payer: 59 | Admitting: Certified Registered"

## 2018-11-30 ENCOUNTER — Encounter (HOSPITAL_COMMUNITY): Payer: Self-pay | Admitting: Certified Registered"

## 2018-11-30 DIAGNOSIS — K8010 Calculus of gallbladder with chronic cholecystitis without obstruction: Secondary | ICD-10-CM | POA: Diagnosis not present

## 2018-11-30 DIAGNOSIS — K7689 Other specified diseases of liver: Secondary | ICD-10-CM | POA: Insufficient documentation

## 2018-11-30 DIAGNOSIS — K219 Gastro-esophageal reflux disease without esophagitis: Secondary | ICD-10-CM | POA: Diagnosis not present

## 2018-11-30 DIAGNOSIS — K8020 Calculus of gallbladder without cholecystitis without obstruction: Secondary | ICD-10-CM | POA: Diagnosis present

## 2018-11-30 HISTORY — PX: CHOLECYSTECTOMY: SHX55

## 2018-11-30 HISTORY — PX: LIVER BIOPSY: SHX301

## 2018-11-30 LAB — POCT PREGNANCY, URINE: Preg Test, Ur: NEGATIVE

## 2018-11-30 SURGERY — LAPAROSCOPIC CHOLECYSTECTOMY
Anesthesia: General | Site: Abdomen

## 2018-11-30 MED ORDER — MEPERIDINE HCL 25 MG/ML IJ SOLN: 6.2500 mg | INTRAMUSCULAR | Status: DC | PRN

## 2018-11-30 MED ORDER — FENTANYL CITRATE (PF) 100 MCG/2ML IJ SOLN
INTRAMUSCULAR | Status: DC | PRN
  Administered 2018-11-30: 50 ug via INTRAVENOUS
  Administered 2018-11-30 (×2): 100 ug via INTRAVENOUS

## 2018-11-30 MED ORDER — HEMOSTATIC AGENTS (NO CHARGE) OPTIME
TOPICAL | Status: DC | PRN
  Administered 2018-11-30: 1 via TOPICAL

## 2018-11-30 MED ORDER — DEXAMETHASONE SODIUM PHOSPHATE 10 MG/ML IJ SOLN
INTRAMUSCULAR | Status: DC | PRN
  Administered 2018-11-30: 10 mg via INTRAVENOUS

## 2018-11-30 MED ORDER — OXYCODONE HCL 5 MG/5ML PO SOLN: 5.0000 mg | Freq: Once | ORAL | Status: AC | PRN

## 2018-11-30 MED ORDER — SODIUM CHLORIDE 0.9 % IR SOLN
Status: DC | PRN
  Administered 2018-11-30: 1000 mL

## 2018-11-30 MED ORDER — SUGAMMADEX SODIUM 200 MG/2ML IV SOLN
INTRAVENOUS | Status: DC | PRN
  Administered 2018-11-30: 200 mg via INTRAVENOUS

## 2018-11-30 MED ORDER — LIDOCAINE 2% (20 MG/ML) 5 ML SYRINGE
INTRAMUSCULAR | Status: DC | PRN
  Administered 2018-11-30: 80 mg via INTRAVENOUS

## 2018-11-30 MED ORDER — ACETAMINOPHEN 500 MG PO TABS
1000.0000 mg | ORAL_TABLET | ORAL | Status: AC
  Administered 2018-11-30: 1000 mg via ORAL
  Filled 2018-11-30: qty 2

## 2018-11-30 MED ORDER — TRAMADOL HCL 50 MG PO TABS: 50.0000 mg | ORAL_TABLET | Freq: Four times a day (QID) | ORAL | 0 refills | Status: DC | PRN

## 2018-11-30 MED ORDER — PROPOFOL 10 MG/ML IV BOLUS
INTRAVENOUS | Status: AC
  Filled 2018-11-30: qty 20

## 2018-11-30 MED ORDER — PROPOFOL 10 MG/ML IV BOLUS
INTRAVENOUS | Status: DC | PRN
  Administered 2018-11-30: 50 mg via INTRAVENOUS
  Administered 2018-11-30: 150 mg via INTRAVENOUS

## 2018-11-30 MED ORDER — CELECOXIB 200 MG PO CAPS
200.0000 mg | ORAL_CAPSULE | ORAL | Status: AC
  Administered 2018-11-30: 200 mg via ORAL
  Filled 2018-11-30: qty 1

## 2018-11-30 MED ORDER — HYDROMORPHONE HCL 1 MG/ML IJ SOLN
INTRAMUSCULAR | Status: AC
  Filled 2018-11-30: qty 1

## 2018-11-30 MED ORDER — OXYCODONE HCL 5 MG PO TABS
5.0000 mg | ORAL_TABLET | Freq: Once | ORAL | Status: AC | PRN
  Administered 2018-11-30: 5 mg via ORAL

## 2018-11-30 MED ORDER — PROMETHAZINE HCL 25 MG/ML IJ SOLN: 6.2500 mg | INTRAMUSCULAR | Status: DC | PRN

## 2018-11-30 MED ORDER — HYDROMORPHONE HCL 1 MG/ML IJ SOLN
0.2500 mg | INTRAMUSCULAR | Status: DC | PRN
  Administered 2018-11-30 (×2): 0.5 mg via INTRAVENOUS

## 2018-11-30 MED ORDER — HYDROMORPHONE HCL 1 MG/ML IJ SOLN
INTRAMUSCULAR | Status: DC | PRN
  Administered 2018-11-30: 0.5 mg via INTRAVENOUS

## 2018-11-30 MED ORDER — ROCURONIUM BROMIDE 50 MG/5ML IV SOSY
PREFILLED_SYRINGE | INTRAVENOUS | Status: DC | PRN
  Administered 2018-11-30: 50 mg via INTRAVENOUS

## 2018-11-30 MED ORDER — ONDANSETRON HCL 4 MG/2ML IJ SOLN
INTRAMUSCULAR | Status: DC | PRN
  Administered 2018-11-30: 4 mg via INTRAVENOUS

## 2018-11-30 MED ORDER — HYDROMORPHONE HCL 1 MG/ML IJ SOLN
INTRAMUSCULAR | Status: AC
  Filled 2018-11-30: qty 0.5

## 2018-11-30 MED ORDER — OXYCODONE HCL 5 MG PO TABS
ORAL_TABLET | ORAL | Status: AC
  Filled 2018-11-30: qty 1

## 2018-11-30 MED ORDER — 0.9 % SODIUM CHLORIDE (POUR BTL) OPTIME
TOPICAL | Status: DC | PRN
  Administered 2018-11-30: 1000 mL

## 2018-11-30 MED ORDER — LACTATED RINGERS IV SOLN
INTRAVENOUS | Status: DC
  Administered 2018-11-30 (×2): via INTRAVENOUS

## 2018-11-30 MED ORDER — MIDAZOLAM HCL 2 MG/2ML IJ SOLN
INTRAMUSCULAR | Status: AC
  Filled 2018-11-30: qty 2

## 2018-11-30 MED ORDER — GABAPENTIN 300 MG PO CAPS
300.0000 mg | ORAL_CAPSULE | ORAL | Status: AC
  Administered 2018-11-30: 300 mg via ORAL
  Filled 2018-11-30: qty 3

## 2018-11-30 MED ORDER — FENTANYL CITRATE (PF) 250 MCG/5ML IJ SOLN
INTRAMUSCULAR | Status: AC
  Filled 2018-11-30: qty 5

## 2018-11-30 MED ORDER — METOPROLOL TARTRATE 5 MG/5ML IV SOLN
INTRAVENOUS | Status: DC | PRN
  Administered 2018-11-30: .5 mg via INTRAVENOUS
  Administered 2018-11-30: 2.5 mg via INTRAVENOUS

## 2018-11-30 MED ORDER — BUPIVACAINE HCL (PF) 0.25 % IJ SOLN
INTRAMUSCULAR | Status: AC
  Filled 2018-11-30: qty 30

## 2018-11-30 MED ORDER — MIDAZOLAM HCL 5 MG/5ML IJ SOLN
INTRAMUSCULAR | Status: DC | PRN
  Administered 2018-11-30: 2 mg via INTRAVENOUS

## 2018-11-30 MED ORDER — BUPIVACAINE HCL 0.25 % IJ SOLN
INTRAMUSCULAR | Status: DC | PRN
  Administered 2018-11-30: 6 mL

## 2018-11-30 MED ORDER — CEFAZOLIN SODIUM-DEXTROSE 2-4 GM/100ML-% IV SOLN
2.0000 g | INTRAVENOUS | Status: AC
  Administered 2018-11-30: 2 g via INTRAVENOUS
  Filled 2018-11-30: qty 100

## 2018-11-30 SURGICAL SUPPLY — 40 items
CANISTER SUCT 3000ML PPV (MISCELLANEOUS) ×3 IMPLANT
CHLORAPREP W/TINT 26 (MISCELLANEOUS) ×3 IMPLANT
CLIP VESOLOCK MED LG 6/CT (CLIP) ×3 IMPLANT
CONT SPEC 4OZ CLIKSEAL STRL BL (MISCELLANEOUS) ×3 IMPLANT
COVER SURGICAL LIGHT HANDLE (MISCELLANEOUS) ×3 IMPLANT
COVER TRANSDUCER ULTRASND (DRAPES) ×3 IMPLANT
COVER WAND RF STERILE (DRAPES) ×3 IMPLANT
DEFOGGER SCOPE WARMER CLEARIFY (MISCELLANEOUS) IMPLANT
DERMABOND ADVANCED (GAUZE/BANDAGES/DRESSINGS) ×2
DERMABOND ADVANCED .7 DNX12 (GAUZE/BANDAGES/DRESSINGS) ×1 IMPLANT
ELECT REM PT RETURN 9FT ADLT (ELECTROSURGICAL) ×3
ELECTRODE REM PT RTRN 9FT ADLT (ELECTROSURGICAL) ×1 IMPLANT
ENDOLOOP SUT PDS II  0 18 (SUTURE) ×4
ENDOLOOP SUT PDS II 0 18 (SUTURE) ×2 IMPLANT
GLOVE BIO SURGEON STRL SZ7.5 (GLOVE) ×3 IMPLANT
GOWN STRL REUS W/ TWL LRG LVL3 (GOWN DISPOSABLE) ×2 IMPLANT
GOWN STRL REUS W/ TWL XL LVL3 (GOWN DISPOSABLE) ×1 IMPLANT
GOWN STRL REUS W/TWL LRG LVL3 (GOWN DISPOSABLE) ×4
GOWN STRL REUS W/TWL XL LVL3 (GOWN DISPOSABLE) ×2
GRASPER SUT TROCAR 14GX15 (MISCELLANEOUS) ×3 IMPLANT
KIT BASIN OR (CUSTOM PROCEDURE TRAY) ×3 IMPLANT
KIT TURNOVER KIT B (KITS) ×3 IMPLANT
NEEDLE INSUFFLATION 14GA 120MM (NEEDLE) ×3 IMPLANT
NS IRRIG 1000ML POUR BTL (IV SOLUTION) ×3 IMPLANT
PAD ARMBOARD 7.5X6 YLW CONV (MISCELLANEOUS) ×3 IMPLANT
POUCH LAPAROSCOPIC INSTRUMENT (MISCELLANEOUS) ×3 IMPLANT
POUCH RETRIEVAL ECOSAC 10 (ENDOMECHANICALS) IMPLANT
POUCH RETRIEVAL ECOSAC 10MM (ENDOMECHANICALS)
SCISSORS LAP 5X35 DISP (ENDOMECHANICALS) ×3 IMPLANT
SET IRRIG TUBING LAPAROSCOPIC (IRRIGATION / IRRIGATOR) ×3 IMPLANT
SET TUBE SMOKE EVAC HIGH FLOW (TUBING) ×3 IMPLANT
SLEEVE ENDOPATH XCEL 5M (ENDOMECHANICALS) ×3 IMPLANT
SPECIMEN JAR SMALL (MISCELLANEOUS) ×3 IMPLANT
SUT MNCRL AB 4-0 PS2 18 (SUTURE) ×3 IMPLANT
TOWEL GREEN STERILE (TOWEL DISPOSABLE) ×3 IMPLANT
TOWEL GREEN STERILE FF (TOWEL DISPOSABLE) ×3 IMPLANT
TRAY LAPAROSCOPIC MC (CUSTOM PROCEDURE TRAY) ×3 IMPLANT
TROCAR XCEL NON-BLD 11X100MML (ENDOMECHANICALS) ×3 IMPLANT
TROCAR XCEL NON-BLD 5MMX100MML (ENDOMECHANICALS) ×3 IMPLANT
WATER STERILE IRR 1000ML POUR (IV SOLUTION) ×3 IMPLANT

## 2018-11-30 NOTE — Discharge Instructions (Signed)
[facility] °LAPAROSCOPIC SURGERY: POST OP INSTRUCTIONS °Always review your discharge instruction sheet given to you by the facility where your surgery was performed. °IF YOU HAVE DISABILITY OR FAMILY LEAVE FORMS, YOU MUST BRING THEM TO THE OFFICE FOR PROCESSING.   °DO NOT GIVE THEM TO YOUR DOCTOR. ° °1. A prescription for pain medication may be given to you upon discharge.  Take your pain medication as prescribed, if needed.  If narcotic pain medicine is not needed, then you may take acetaminophen (Tylenol) or ibuprofen (Advil) as needed. °2. Take your usually prescribed medications unless otherwise directed. °3. If you need a refill on your pain medication, please contact your pharmacy.  They will contact our office to request authorization. Prescriptions will not be filled after 5pm or on week-ends. °4. You should follow a light diet the first few days after arrival home, such as soup and crackers, etc.  Be sure to include lots of fluids daily. °5. Most patients will experience some swelling and bruising in the area of the incisions.  Ice packs will help.  Swelling and bruising can take several days to resolve.  °6. It is common to experience some constipation if taking pain medication after surgery.  Increasing fluid intake and taking a stool softener (such as Colace) will usually help or prevent this problem from occurring.  A mild laxative (Milk of Magnesia or Miralax) should be taken according to package instructions if there are no bowel movements after 48 hours. °7. Unless discharge instructions indicate otherwise, you may remove your bandages 24-48 hours after surgery, and you may shower at that time.  You may have steri-strips (small skin tapes) in place directly over the incision.  These strips should be left on the skin for 7-10 days.  If your surgeon used skin glue on the incision, you may shower in 24 hours.  The glue will flake off over the next 2-3 weeks.  Any sutures or  staples will be removed at the office during your follow-up visit. °8. ACTIVITIES:  You may resume regular (light) daily activities beginning the next day--such as daily self-care, walking, climbing stairs--gradually increasing activities as tolerated.  You may have sexual intercourse when it is comfortable.  Refrain from any heavy lifting or straining until approved by your doctor. °a. You may drive when you are no longer taking prescription pain medication, you can comfortably wear a seatbelt, and you can safely maneuver your car and apply brakes. °b. RETURN TO WORK:  __________________________________________________________ °9. You should see your doctor in the office for a follow-up appointment approximately 2-3 weeks after your surgery.  Make sure that you call for this appointment within a day or two after you arrive home to insure a convenient appointment time. °10. OTHER INSTRUCTIONS: __________________________________________________________________________________________________________________________ __________________________________________________________________________________________________________________________ °WHEN TO CALL YOUR DOCTOR: °1. Fever over 101.0 °2. Inability to urinate °3. Continued bleeding from incision. °4. Increased pain, redness, or drainage from the incision. °5. Increasing abdominal pain ° °The clinic staff is available to answer your questions during regular business hours.  Please don’t hesitate to call and ask to speak to one of the nurses for clinical concerns.  If you have a medical emergency, go to the nearest emergency room or call 911.  A surgeon from [facility] is always on call at the hospital. °[address], [address], [place], [place]  [address] ? [address] [address], [place], [place]   [address] °[phone] ? [phone] ? FAX [phone] °Web site:   [email or web address] °

## 2018-11-30 NOTE — Anesthesia Preprocedure Evaluation (Signed)
Anesthesia Evaluation  Patient identified by MRN, date of birth, ID band Patient awake    Reviewed: Allergy & Precautions, NPO status , Patient's Chart, lab work & pertinent test results  History of Anesthesia Complications Negative for: history of anesthetic complications  Airway Mallampati: II  TM Distance: >3 FB Neck ROM: Full    Dental no notable dental hx. (+) Dental Advisory Given   Pulmonary neg pulmonary ROS,    Pulmonary exam normal breath sounds clear to auscultation       Cardiovascular Exercise Tolerance: Good negative cardio ROS Normal cardiovascular exam Rhythm:Regular Rate:Normal     Neuro/Psych  Headaches, Anxiety negative psych ROS   GI/Hepatic Neg liver ROS, GERD  Medicated and Controlled,  Endo/Other  negative endocrine ROSobesity  Renal/GU negative Renal ROS  negative genitourinary   Musculoskeletal negative musculoskeletal ROS (+)   Abdominal (+) + obese,   Peds negative pediatric ROS (+)  Hematology ITP, platelet count consistently in low 100s, just prior to epidural placement was 113 and will obtain a count prior to discontinuation of catheter   Anesthesia Other Findings   Reproductive/Obstetrics                             Anesthesia Physical  Anesthesia Plan  ASA: II  Anesthesia Plan: General   Post-op Pain Management:    Induction: Intravenous, Rapid sequence and Cricoid pressure planned  PONV Risk Score and Plan: 3 and Ondansetron, Dexamethasone, Midazolam and Treatment may vary due to age or medical condition  Airway Management Planned: Oral ETT  Additional Equipment:   Intra-op Plan:   Post-operative Plan: Extubation in OR  Informed Consent: I have reviewed the patients History and Physical, chart, labs and discussed the procedure including the risks, benefits and alternatives for the proposed anesthesia with the patient or authorized  representative who has indicated his/her understanding and acceptance.     Dental advisory given  Plan Discussed with: CRNA  Anesthesia Plan Comments:         Anesthesia Quick Evaluation

## 2018-11-30 NOTE — Anesthesia Procedure Notes (Signed)
Procedure Name: Intubation Date/Time: 11/30/2018 10:12 AM Performed by: Babs Bertin, CRNA Pre-anesthesia Checklist: Patient identified, Emergency Drugs available, Suction available and Patient being monitored Patient Re-evaluated:Patient Re-evaluated prior to induction Oxygen Delivery Method: Circle System Utilized Preoxygenation: Pre-oxygenation with 100% oxygen Induction Type: IV induction and Rapid sequence Laryngoscope Size: Mac and 3 Grade View: Grade I Tube type: Oral Tube size: 7.0 mm Number of attempts: 1 Airway Equipment and Method: Stylet and Oral airway Placement Confirmation: ETT inserted through vocal cords under direct vision,  positive ETCO2 and breath sounds checked- equal and bilateral Secured at: 21 cm Tube secured with: Tape Dental Injury: Teeth and Oropharynx as per pre-operative assessment

## 2018-11-30 NOTE — Anesthesia Postprocedure Evaluation (Signed)
Anesthesia Post Note  Patient: Rachael Chandler  Procedure(s) Performed: LAPAROSCOPIC CHOLECYSTECTOMY (N/A Abdomen) Liver Biopsy (N/A Abdomen)     Patient location during evaluation: PACU Anesthesia Type: General Level of consciousness: awake and alert Pain management: pain level controlled Vital Signs Assessment: post-procedure vital signs reviewed and stable Respiratory status: spontaneous breathing, nonlabored ventilation and respiratory function stable Cardiovascular status: blood pressure returned to baseline and stable Postop Assessment: no apparent nausea or vomiting Anesthetic complications: no    Last Vitals:  Vitals:   11/30/18 1215 11/30/18 1225  BP:  (!) 145/91  Pulse: 71 60  Resp: 15 17  Temp:  36.7 C  SpO2: 100% 97%    Last Pain:  Vitals:   11/30/18 1225  PainSc: 7                  Tonnette Zwiebel Ray [employer]

## 2018-11-30 NOTE — Op Note (Signed)
11/30/2018  11:33 AM  PATIENT:  Rachael Chandler  38 y.o. female  PRE-OPERATIVE DIAGNOSIS:  GALLSTONES  POST-OPERATIVE DIAGNOSIS:  GALLSTONES, liver mass  PROCEDURE:  Procedure(s): LAPAROSCOPIC CHOLECYSTECTOMY (N/A) Liver Biopsy (N/A)  SURGEON:  Surgeon(s) and Role:    Ralene Ok, MD - Primary  ANESTHESIA:   local and general  EBL:  20 mL   BLOOD ADMINISTERED:none  DRAINS: none   LOCAL MEDICATIONS USED:  BUPIVICAINE   SPECIMEN:  Source of Specimen:  Gallbladder and liver biopsy  DISPOSITION OF SPECIMEN:  PATHOLOGY  COUNTS:  YES  TOURNIQUET:  * No tourniquets in log *  DICTATION: .Dragon Dictation  EBL: <1DV   Complications: none   Counts: reported as correct x 2   Findings:chronically inflamed gallbladder, and small 2cm liver mass on surface of liver.  Pictures in the chart.  Indications for procedure: [name]   Details of the procedure: The patient was taken to the operating and placed in the supine position with bilateral SCDs in place. A time out was called and all facts were verified. A pneumoperitoneum was obtained via A Veress needle technique to a pressure of 97mm of mercury. A 15mm trochar was then placed in the right upper quadrant under visualization, and there were no injuries to any abdominal organs. A 11 mm port was then placed in the umbilical region after infiltrating with local anesthesia under direct visualization. A second epigastric port was placed under direct visualization.   The gallbladder was identified and retracted, the peritoneum was then sharply dissected from the gallbladder and this dissection was carried down to Calot's triangle. The cystic duct was identified and dissected circumferentially and seen going into the gallbladder 360.  The cystic artery was dissected away from the surrounding tissues.   The critical angle was obtained.   Due to the fact that the cystic duct was  very large, this was likely the proximal portion of the neck.  The neck was transected.  A 0 PDS Endoloop was then placed around the cystic duct/neck of the gallbladder.  The cystic artery was identified and 2 clips placed proximally and one distally and transected. We then proceeded to remove the gallbladder off the hepatic fossa with Bovie cautery. A retrieval bag was then placed in the abdomen and gallbladder placed in the bag. The hepatic fossa was then reexamined and hemostasis was achieved with Bovie cautery and was excellent at this portion of the case. The subhepatic fossa and perihepatic fossa was then irrigated until the effluent was clear.   There is a small approximate 1-1/2 to 2 cm mass on the surface of the liver.  This was biopsied with cautery.  This was sent to pathology under permanent.  The area was cauterized to help with hemostasis.  A piece of Surgicel was placed on top of the excision site to help with coagulation.  The area was dry prior to [place] closing.  The specimen bag and specimen were removed from the abdominal cavity.  The 11 mm trocar fascia was reapproximated with the Endo Close #1 Vicryl x2. The pneumoperitoneum was evacuated and all trochars removed under direct visulalization. The skin was then closed with 4-0 Monocryl and the skin dressed with Dermabond. The patient was awaken from general anesthesia and taken to the recovery room in stable condition.    PLAN OF CARE: Discharge to home after PACU  PATIENT DISPOSITION:  PACU - hemodynamically stable.  Delay start of Pharmacological VTE agent (>24hrs) due to surgical blood loss or risk of bleeding: not applicable  

## 2018-11-30 NOTE — H&P (Signed)
History of Present Illness The patient is a 38 year old female who presents for evaluation of gall stones. Referred by: Dr. Blinda LeatherwoodPollina Chief Complaint: Abdominal pain  Patient is a 38 year old female with a past medical history comes in with a known history of gallstones. Patient states that approximately 2 weeks ago she began having epigastric abdominal pain. She states this radiated to the back. Patient states that she had a previous episode in the past and proceeded the ER secondary to rule out an MI. This was ruled out. Patient states that recently her abdominal pain was associated with some nausea and emesis. Patient states that she has noticed that the pain is brought on by higher fatty foods. She's been avoiding these which has helped.  Patient underwent ultrasound revealed multiple gallstones. Patient had LFTs which were marginally high. She had a previous history of T bili that was high in the past.  She's had a Previous C-section    Past Surgical History Cesarean Section - 1  Oral Surgery   Diagnostic Studies History  Colonoscopy  never Mammogram  >3 years ago Pap Smear  1-5 years ago  Allergies  No Known Drug Allergies  [10/14/2018]: Allergies Reconciled   Medication History  Allegra (30MG  Tablet, Oral) Active. Medications Reconciled  Social History  Alcohol use  Moderate alcohol use. Caffeine use  Coffee. No drug use  Tobacco use  Never smoker.  Family History Alcohol Abuse  Father, Mother. Bleeding disorder  Mother. Cancer  Father, Mother. Depression  Father, Mother. Heart disease in female family member before age 38  Migraine Headache  Mother.  Pregnancy / Birth History  Age at menarche  11 years. Gravida  2 Irregular periods  Length (months) of breastfeeding  7-12 Maternal age  38-35 Para  1  Other Problems  Back Pain  Chest pain  Cholelithiasis  Depression  Gastric Ulcer  Gastroesophageal Reflux Disease   Kidney Stone     Review of Systems General Not Present- Appetite Loss, Chills, Fatigue, Fever, Night Sweats, Weight Gain and Weight Loss. Skin Not Present- Change in Wart/Mole, Dryness, Hives, Jaundice, New Lesions, Non-Healing Wounds, Rash and Ulcer. HEENT Present- Seasonal Allergies and Wears glasses/contact lenses. Not Present- Earache, Hearing Loss, Hoarseness, Nose Bleed, Oral Ulcers, Ringing in the Ears, Sinus Pain, Sore Throat, Visual Disturbances and Yellow Eyes. Respiratory Not Present- Bloody sputum, Chronic Cough, Difficulty Breathing, Snoring and Wheezing. Breast Not Present- Breast Mass, Breast Pain, Nipple Discharge and Skin Changes. Cardiovascular Not Present- Chest Pain, Difficulty Breathing Lying Down, Leg Cramps, Palpitations, Rapid Heart Rate, Shortness of Breath and Swelling of Extremities. Gastrointestinal Not Present- Abdominal Pain, Bloating, Bloody Stool, Change in Bowel Habits, Chronic diarrhea, Constipation, Difficulty Swallowing, Excessive gas, Gets full quickly at meals, Hemorrhoids, Indigestion, Nausea, Rectal Pain and Vomiting. Female Genitourinary Not Present- Frequency, Nocturia, Painful Urination, Pelvic Pain and Urgency. Musculoskeletal Not Present- Back Pain, Joint Pain, Joint Stiffness, Muscle Pain, Muscle Weakness and Swelling of Extremities. Neurological Not Present- Decreased Memory, Fainting, Headaches, Numbness, Seizures, Tingling, Tremor, Trouble walking and Weakness. Psychiatric Present- Depression. Not Present- Anxiety, Bipolar, Change in Sleep Pattern, Fearful and Frequent crying. Endocrine Not Present- Cold Intolerance, Excessive Hunger, Hair Changes, Heat Intolerance, Hot flashes and New Diabetes. Hematology Not Present- Blood Thinners, Easy Bruising, Excessive bleeding, Gland problems, HIV and Persistent Infections. All other systems negative  BP 110/72   Pulse 83   Temp 98.3 F (36.8 C)   Resp 17   Ht 5' 3.5" (1.613 m)   Wt 80.6 kg  LMP  11/23/2018   SpO2 99%   BMI 30.98 kg/m    Physical Exam  The physical exam findings are as follows: Note: Constitutional: No acute distress, conversant, appears stated age  Eyes: Anicteric sclerae, moist conjunctiva, no lid lag  Neck: No thyromegaly, trachea midline, no cervical lymphadenopathy  Lungs: Clear to auscultation biilaterally, normal respiratory effot  Cardiovascular: regular rate & rhythm, no murmurs, no peripheal edema, pedal pulses 2+  GI: Soft, no masses or hepatosplenomegaly, non-tender to palpation  MSK: Normal gait, no clubbing cyanosis, edema  Skin: No rashes, palpation reveals normal skin turgor  Psychiatric: Appropriate judgment and insight, oriented to person, place, and time    Assessment & Plan   SYMPTOMATIC CHOLELITHIASIS (K80.20) Impression: 38 year old female symptomatic cholelithiasis  1. We will proceed to the operating room for a laparoscopic cholecystectomy  2. Risks and benefits were discussed with the patient to generally include, but not limited to: infection, bleeding, possible need for post op ERCP, damage to the bile ducts, bile leak, and possible need for further surgery. Alternatives were offered and described. All questions were answered and the patient voiced understanding of the procedure and wishes to proceed at this point with a laparoscopic cholecystectomy

## 2018-11-30 NOTE — Transfer of Care (Signed)
Immediate Anesthesia Transfer of Care Note  Patient: Rachael Chandler  Procedure(s) Performed: LAPAROSCOPIC CHOLECYSTECTOMY (N/A Abdomen) Liver Biopsy (N/A Abdomen)  Patient Location: PACU  Anesthesia Type:General  Level of Consciousness: awake, alert  and oriented  Airway & Oxygen Therapy: Patient Spontanous Breathing and Patient connected to face mask oxygen  Post-op Assessment: Report given to RN and Post -op Vital signs reviewed and stable  Post vital signs: Reviewed and stable  Last Vitals:  Vitals Value Taken Time  BP    Temp    Pulse 82 11/30/18 1126  Resp 15 11/30/18 1126  SpO2 95 % 11/30/18 1126  Vitals shown include unvalidated device data.  Last Pain:  Vitals:   11/30/18 0922  PainSc: 0-No pain      Patients Stated Pain Goal: 3 (90/30/09 2330)  Complications: No apparent anesthesia complications

## 2018-12-01 ENCOUNTER — Encounter (HOSPITAL_COMMUNITY): Payer: Self-pay | Admitting: General Surgery

## 2018-12-07 ENCOUNTER — Other Ambulatory Visit (HOSPITAL_COMMUNITY): Payer: Self-pay | Admitting: General Surgery

## 2018-12-07 ENCOUNTER — Other Ambulatory Visit: Payer: Self-pay | Admitting: General Surgery

## 2018-12-07 ENCOUNTER — Other Ambulatory Visit: Payer: Self-pay

## 2018-12-07 ENCOUNTER — Ambulatory Visit (HOSPITAL_COMMUNITY)
Admission: RE | Admit: 2018-12-07 | Discharge: 2018-12-07 | Disposition: A | Discharge status: Home / Self Care | Payer: 59 | Source: Ambulatory Visit | Attending: General Surgery | Admitting: General Surgery

## 2018-12-07 DIAGNOSIS — R1011 Right upper quadrant pain: Secondary | ICD-10-CM

## 2018-12-11 DIAGNOSIS — R160 Hepatomegaly, not elsewhere classified: Secondary | ICD-10-CM | POA: Insufficient documentation

## 2019-03-23 ENCOUNTER — Other Ambulatory Visit: Payer: Self-pay

## 2019-03-23 DIAGNOSIS — Z20822 Contact with and (suspected) exposure to covid-19: Secondary | ICD-10-CM

## 2019-03-24 LAB — NOVEL CORONAVIRUS, NAA: SARS-CoV-2, NAA: NOT DETECTED

## 2019-03-29 ENCOUNTER — Ambulatory Visit: Payer: Self-pay | Admitting: Physician Assistant

## 2019-03-29 ENCOUNTER — Telehealth: Payer: Self-pay | Admitting: Physician Assistant

## 2019-03-29 ENCOUNTER — Other Ambulatory Visit: Payer: Self-pay

## 2019-03-29 DIAGNOSIS — R059 Cough, unspecified: Secondary | ICD-10-CM

## 2019-03-29 DIAGNOSIS — R05 Cough: Secondary | ICD-10-CM

## 2019-03-29 MED ORDER — AMOXICILLIN-POT CLAVULANATE 875-125 MG PO TABS: 1.0000 | ORAL_TABLET | Freq: Two times a day (BID) | ORAL | 0 refills | Status: AC

## 2019-03-29 MED ORDER — BENZONATATE 100 MG PO CAPS: 100.0000 mg | ORAL_CAPSULE | Freq: Two times a day (BID) | ORAL | 0 refills | Status: DC | PRN

## 2019-03-29 NOTE — Progress Notes (Signed)
We are sorry that you are not feeling well.  Here is how we plan to help!  Based on your presentation I believe you most likely have A cough due to bacteria.  When patients have a fever and a productive cough with a change in color or increased sputum production, we are concerned about bacterial bronchitis.  If left untreated it can progress to pneumonia.  If your symptoms do not improve with your treatment plan it is important that you contact your provider.   In addition you may use A prescription cough medication called Tessalon Perles 100mg . You may take 1-2 capsules every 8 hours as needed for your cough.  As well as a prescription antibiotic called Augmentin  From your responses in the eVisit questionnaire you describe inflammation in the upper respiratory tract which is causing a significant cough.  This is commonly called Bronchitis and has four common causes:    Allergies  Viral Infections  Acid Reflux  Bacterial Infection Allergies, viruses and acid reflux are treated by controlling symptoms or eliminating the cause. An example might be a cough caused by taking certain blood pressure medications. You stop the cough by changing the medication. Another example might be a cough caused by acid reflux. Controlling the reflux helps control the cough.  In the context of Covid 19, even though you tested negative you should still quarantine for at least 10 days from your symptom onset and until you are fever free for 72 hours without fever reducing medications  HOME CARE . Only take medications as instructed by your medical team. . Complete the entire course of an antibiotic. . Drink plenty of fluids and get plenty of rest. . Avoid close contacts especially the very young and the elderly . Cover your mouth if you cough or cough into your sleeve. . Always remember to wash your hands . A steam or ultrasonic humidifier can help congestion.   GET HELP RIGHT AWAY IF: . You develop worsening  fever. . You become short of breath . You cough up blood. . Your symptoms persist after you have completed your treatment plan MAKE SURE YOU   Understand these instructions.  Will watch your condition.  Will get help right away if you are not doing well or get worse.  Your e-visit answers were reviewed by a board certified advanced clinical practitioner to complete your personal care plan.  Depending on the condition, your plan could have included both over the counter or prescription medications. If there is a problem please reply  once you have received a response from your provider. Your safety is important to [place].  If you have drug allergies check your prescription carefully.    You can use MyChart to ask questions about today's visit, request a non-urgent call back, or ask for a work or school excuse for 24 hours related to this e-Visit. If it has been greater than 24 hours you will need to follow up with your provider, or enter a new e-Visit to address those concerns. You will get an e-mail in the next two days asking about your experience.  I hope that your e-visit has been valuable and will speed your recovery. Thank you for using e-visits.  6 minutes spent on this chart

## 2019-04-05 ENCOUNTER — Telehealth: Payer: 59 | Admitting: Physician Assistant

## 2019-04-05 DIAGNOSIS — R05 Cough: Secondary | ICD-10-CM | POA: Diagnosis not present

## 2019-04-05 DIAGNOSIS — J069 Acute upper respiratory infection, unspecified: Secondary | ICD-10-CM

## 2019-04-05 DIAGNOSIS — R059 Cough, unspecified: Secondary | ICD-10-CM

## 2019-04-05 MED ORDER — PROMETHAZINE-DM 6.25-15 MG/5ML PO SYRP: 5.0000 mL | ORAL_SOLUTION | Freq: Four times a day (QID) | ORAL | 0 refills | Status: DC | PRN

## 2019-04-05 MED ORDER — ALBUTEROL SULFATE HFA 108 (90 BASE) MCG/ACT IN AERS: 2.0000 | INHALATION_SPRAY | RESPIRATORY_TRACT | 0 refills | Status: DC | PRN

## 2019-04-05 NOTE — Progress Notes (Signed)
We are sorry you are not feeling well.  Here is how we plan to help!  Based on what you have shared with me, it looks like you may have an upper respiratory infection.  Upper respiratory infections are caused by a large number of viruses; however, rhinovirus is the most common cause.   Symptoms vary from person to person, with common symptoms including sore throat, cough, fatigue or lack of energy and feeling of general discomfort.  A low-grade fever of up to 100.4 may present, but is often uncommon.  Symptoms vary however, and are closely related to a person's age or underlying illnesses.  The most common symptoms associated with an upper respiratory infection are nasal discharge or congestion, cough, sneezing, headache and pressure in the ears and face.  These symptoms usually persist for about 3 to 10 days, but can last up to 2 weeks.  It is important to know that upper respiratory infections do not cause serious illness or complications in most cases.    Upper respiratory infections can be transmitted from person to person, with the most common method of transmission being a person's hands.  The virus is able to live on the skin and can infect other persons for up to 2 hours after direct contact.  Also, these can be transmitted when someone coughs or sneezes; thus, it is important to cover the mouth to reduce this risk.  To keep the spread of the illness at bay, good hand hygiene is very important.  This is an infection that is most likely caused by a virus. There are no specific treatments other than to help you with the symptoms until the infection runs its course.  We are sorry you are not feeling well.  Here is how we plan to help!  Treatment: Finish the course of your antibiotics.  Start using Albuterol inhaler for wheezing. Use 2 puffs every 6 hours as needed. .  Take Mucinex-DM to help with cough and congestion during the day.  Use Flonase nasal spray at night before bed.  Take the  Phenergan DM cough medication at night.  If your symptoms do not noticibly improve in the next 7 days please be seen for a face to face appointment - you may need a chest x-ray.  You may have a bit of a lingering cough for a few weeks.   For nasal congestion, you may use an oral decongestants such as Mucinex D or if you have glaucoma or high blood pressure use plain Mucinex.  Saline nasal spray or nasal drops can help and can safely be used as often as needed for congestion.  For your congestion, I have prescribed Ipratropium Bromide nasal spray 0.03% two sprays in each nostril 2-3 times a day  If you do not have a history of heart disease, hypertension, diabetes or thyroid disease, prostate/bladder issues or glaucoma, you may also use Sudafed to treat nasal congestion.  It is highly recommended that you consult with a pharmacist or your primary care physician to ensure this medication is safe for you to take.     If you have a cough, you may use cough suppressants such as Delsym and Robitussin.  If you have glaucoma or high blood pressure, you can also use Coricidin HBP.   For cough I have prescribed for you A prescription cough medication called Tessalon Perles 100 mg. You may take 1-2 capsules every 8 hours as needed for cough    If you have a sore  or scratchy throat, use a saltwater gargle-  to  teaspoon of salt dissolved in a 4-ounce to 8-ounce glass of warm water.  Gargle the solution for approximately 15-30 seconds and then spit.  It is important not to swallow the solution.  You can also use throat lozenges/cough drops and Chloraseptic spray to help with throat pain or discomfort.  Warm or cold liquids can also be helpful in relieving throat pain.  For headache, pain or general discomfort, you can use Ibuprofen or Tylenol as directed.   Some authorities believe that zinc sprays or the use of Echinacea may shorten the course of your symptoms.  Stay well hydrated. Drink enough water and  fluids to keep your urine clear or pale yellow.  Advil or ibuprofen for pain. Do not take Aspirin.   -Cepacol throat lozenges. -For sore throat try using a honey-based tea. Use 3 teaspoons of honey with juice squeezed from half lemon. Place shaved pieces of ginger into 1/2-1 cup of water and warm over stove top. Then mix the ingredients and repeat every 4 hours as needed.  -Foods that can help speed recovery: honey, garlic, chicken soup, elderberries, green tea.  -Supplements that can help speed recovery: vitamin C, zinc, elderberry extract   HOME CARE . Only take medications as instructed by your medical team. . Be sure to drink plenty of fluids. Water is fine as well as fruit juices, sodas and electrolyte beverages. You may want to stay away from caffeine or alcohol. If you are nauseated, try taking small sips of liquids. How do you know if you are getting enough fluid? Your urine should be a pale yellow or almost colorless. . Get rest. . Taking a steamy shower or using a humidifier may help nasal congestion and ease sore throat pain. You can place a towel over your head and breathe in the steam from hot water coming from a faucet. . Using a saline nasal spray works much the same way. . Cough drops, hard candies and sore throat lozenges may ease your cough. . Avoid close contacts especially the very young and the elderly . Cover your mouth if you cough or sneeze . Always remember to wash your hands.   GET HELP RIGHT AWAY IF: . You develop worsening fever. . If your symptoms do not improve within 10 days . You develop yellow or green discharge from your nose over 3 days. . You have coughing fits . You develop a severe head ache or visual changes. . You develop shortness of breath, difficulty breathing or start having chest pain . Your symptoms persist after you have completed your treatment plan  MAKE SURE YOU   Understand these instructions.  Will watch your condition.  Will  get help right away if you are not doing well or get worse.  Your e-visit answers were reviewed by a board certified advanced clinical practitioner to complete your personal care plan. Depending upon the condition, your plan could have included both over the counter or prescription medications. Please review your pharmacy choice. If there is a problem, you may call our nursing hot line at and have the prescription routed to another pharmacy. Your safety is important to [place]. If you have drug allergies check your prescription carefully.   You can use MyChart to ask questions about today's visit, request a non-urgent call back, or ask for a work or school excuse for 24 hours related to this e-Visit. If it has been greater than 24 hours you  will need to follow up with your provider, or enter a new e-Visit to address those concerns. You will get an e-mail in the next two days asking about your experience.  I hope that your e-visit has been valuable and will speed your recovery. Thank you for using e-visits.     Greater than 5 minutes, yet less than 10 minutes of time have been spent researching, coordinating and implementing care for this patient today.

## 2019-04-08 ENCOUNTER — Telehealth: Payer: Self-pay | Admitting: *Deleted

## 2019-04-08 NOTE — Telephone Encounter (Signed)
Called and left a detailed message to advise that per PCP she would need to have a VV before we can order an X-Ray.

## 2019-04-08 NOTE — Telephone Encounter (Signed)
Since those were e-visits and no one has laid eyes on her, I would like her to schedule a video visit this afternoon to discuss symptoms and treatments she has already had

## 2019-04-08 NOTE — Telephone Encounter (Signed)
Patient called in stating that she has done 2 E-Visits over the last two weeks and she tested negative for COVID.  She still has a pretty bad cough and said that she was told if she is no better she would need to reach out to see if we could order a chest X-ray.  She is asking if we can order this for her.   CB: [phone]

## 2019-04-08 NOTE — Telephone Encounter (Signed)
Please advise 

## 2019-04-12 ENCOUNTER — Encounter: Payer: Self-pay | Admitting: Family Medicine

## 2019-04-12 ENCOUNTER — Other Ambulatory Visit: Payer: Self-pay

## 2019-04-12 ENCOUNTER — Ambulatory Visit (INDEPENDENT_AMBULATORY_CARE_PROVIDER_SITE_OTHER): Payer: 59 | Admitting: Family Medicine

## 2019-04-12 VITALS — Temp 98.0°F | Wt 158.0 lb

## 2019-04-12 DIAGNOSIS — J069 Acute upper respiratory infection, unspecified: Secondary | ICD-10-CM | POA: Diagnosis not present

## 2019-04-12 MED ORDER — PREDNISONE 10 MG PO TABS: ORAL_TABLET | ORAL | 0 refills | Status: DC

## 2019-04-12 NOTE — Progress Notes (Signed)
I have discussed the procedure for the virtual visit with the patient who has given consent to proceed with assessment and treatment.   Rachael Chandler, CMA     

## 2019-04-12 NOTE — Progress Notes (Signed)
Virtual Visit via Video   I connected with patient on 04/12/19 at  3:00 PM EST by a video enabled telemedicine application and verified that I am speaking with the correct person using two identifiers.  Location patient: Home Location provider: [facility], Office Persons participating in the virtual visit: Patient, Provider, CMA (Jess B)  I discussed the limitations of evaluation and management by telemedicine and the availability of in person appointments. The patient expressed understanding and agreed to proceed.  Subjective:   HPI:   Cough- pt had 2 E-visits for her cough on 12/7 and 12/14.  On 12/7 was tx'd w/ Augmentin and Tessalon.  On 12/14 she was given albuterol HFA and Promethazine DM.  sxs started the weekend after Thanksgiving.  Son had a cough prior.  Had similar thing a year ago.  Pt feels sxs start as allergies and then progresses.  No relief w/ abx- finished 4 days ago.  Yesterday developed increased nasal congestion.  The rattling that she had a few weeks ago has improved.  Some SOB w/ prolonged talking.  Had negative COVID test beginning of December.  Has not been taking allergy meds regularly- restart Allegra and Flonase yesterday.  Cough is predominate sx.  ROS:   See pertinent positives and negatives per HPI.  Patient Active Problem List   Diagnosis Date Noted  . Genetic testing 03/25/2018  . Family history of stomach cancer   . Family history of liver cancer   . Family history of prostate cancer   . Vitamin D deficiency 02/04/2018  . Allergic rhinitis 11/28/2017  . Breast lump 04/04/2017  . Physical exam 08/18/2015  . Chest pain 08/18/2015  . Epistaxis, recurrent 05/12/2014    Social History   Tobacco Use  . Smoking status: Never Smoker  . Smokeless tobacco: Never Used  Substance Use Topics  . Alcohol use: Yes    Comment: social    Current Outpatient Medications:  .  acetaminophen (TYLENOL) 325 MG tablet, Take 650 mg by mouth every 6  (six) hours as needed for moderate pain or headache., Disp: , Rfl:  .  albuterol (VENTOLIN HFA) 108 (90 Base) MCG/ACT inhaler, Inhale 2 puffs into the lungs every 4 (four) hours as needed for wheezing or shortness of breath (cough, shortness of breath or wheezing.)., Disp: 6.7 g, Rfl: 0 .  fexofenadine (ALLEGRA) 180 MG tablet, Take 180 mg by mouth daily as needed for allergies or rhinitis., Disp: , Rfl:  .  fluticasone (FLONASE) 50 MCG/ACT nasal spray, Place 2 sprays into both nostrils daily. (Patient taking differently: Place 2 sprays into both nostrils daily as needed for allergies. ), Disp: 16 g, Rfl: 6 .  ibuprofen (ADVIL) 200 MG tablet, Take 400 mg by mouth every 6 (six) hours as needed for headache or moderate pain., Disp: , Rfl:  .  calcium carbonate (TUMS - DOSED IN MG ELEMENTAL CALCIUM) 500 MG chewable tablet, Chew 4 tablets by mouth 2 (two) times daily as needed for indigestion or heartburn., Disp: , Rfl:  .  famotidine (PEPCID) 20 MG tablet, Take 1 tablet (20 mg total) by mouth 2 (two) times daily. (Patient not taking: Reported on 04/12/2019), Disp: 60 tablet, Rfl: 0 .  promethazine-dextromethorphan (PROMETHAZINE-DM) 6.25-15 MG/5ML syrup, Take 5 mLs by mouth 4 (four) times daily as needed. (Patient not taking: Reported on 04/12/2019), Disp: 118 mL, Rfl: 0  No Known Allergies  Objective:   Temp 98 F (36.7 C) (Tympanic)   Wt 158 lb (71.7 kg)  BMI 27.55 kg/m  AAOx3, NAD NCAT, EOMI No obvious CN deficits Coloring WNL + barking cough Pt is able to speak clearly, coherently without shortness of breath or increased work of breathing.  Thought process is linear.  Mood is appropriate.   Assessment and Plan:   Cough- new.  pt was tx'd w/ Augmentin and cough suppressants but continues to cough.  Reports she was not taking her allergy medications regularly but did restart yesterday.  Encouraged her to use inhaler when she was having coughing spells.  Suspect post-infectious cough given  the duration and the fact that she otherwise feels well.  Will start Prednisone taper and monitor closely for improvement.  If no better, will need to get CXR.  Pt expressed understanding and is in agreement w/ plan.    Annye Asa, MD 04/12/2019

## 2019-04-14 ENCOUNTER — Other Ambulatory Visit: Payer: 59

## 2019-04-20 ENCOUNTER — Telehealth: Payer: Self-pay | Admitting: *Deleted

## 2019-04-20 MED ORDER — FLUTICASONE PROPIONATE 50 MCG/ACT NA SUSP: 2.0000 | Freq: Every day | NASAL | 6 refills | Status: AC

## 2019-04-20 NOTE — Telephone Encounter (Signed)
Patient calling in stating that she is about 5 weeks, and she has finished all the medication and she is still having some facial pressure and dizziness.  Asking what else might could be done as the dizziness is a new symptom in all of this.Marland KitchenMarland KitchenMarland Kitchen

## 2019-04-21 MED ORDER — AZITHROMYCIN 500 MG PO TABS: ORAL_TABLET | ORAL | 0 refills | Status: DC

## 2019-04-21 NOTE — Addendum Note (Signed)
Addended by: Desmond Dike L on: 04/21/2019 10:08 AM   Modules accepted: Orders

## 2019-04-21 NOTE — Telephone Encounter (Signed)
We can do a Zpack, increase fluid intake, and if no improvement will do ENT referral

## 2019-04-21 NOTE — Telephone Encounter (Signed)
Called and LMOVM to advise pt that med was ordered.

## 2019-05-19 ENCOUNTER — Ambulatory Visit: Payer: 59 | Attending: Internal Medicine

## 2019-05-19 DIAGNOSIS — Z20822 Contact with and (suspected) exposure to covid-19: Secondary | ICD-10-CM | POA: Insufficient documentation

## 2019-05-20 ENCOUNTER — Telehealth: Payer: Self-pay

## 2019-05-20 DIAGNOSIS — R05 Cough: Secondary | ICD-10-CM

## 2019-05-20 DIAGNOSIS — R059 Cough, unspecified: Secondary | ICD-10-CM

## 2019-05-20 LAB — NOVEL CORONAVIRUS, NAA: SARS-CoV-2, NAA: NOT DETECTED

## 2019-05-20 NOTE — Telephone Encounter (Signed)
If symptoms have not improved w/ abx, albuterol inhaler, prednisone, daily allergy medication (pill and nasal spray) I would recommend a pulmonary referral.  In the mean time, I would also start daily acid reducer (omeprazole 20mg  daily, #30, 1 refill)

## 2019-05-20 NOTE — Addendum Note (Signed)
Addended by: Geannie Risen on: 05/20/2019 04:59 PM   Modules accepted: Orders

## 2019-05-20 NOTE — Telephone Encounter (Signed)
I called and talked to the patient and explained that this the coding charge is valid and that it was not a time base charge. She stated that she understood and was just caught off guard by how expensive her visit was and was also upset that we would not see her in the office with a negative COVID test. I explained to her that this is our protocol and explained that we just don't have the PPE in our office to keep our patients and ourselves safe. And that we recommend urgent care because they do have the PPE available. She stated that she understood.   I also asked her how she was feeling now. She explained that she still has some raspy breathing  and some coughing. She said this has been going on since November. She wants to know what she should do and if there is anything else that she can try.She said that she has just been trying to wait it out and that it is gradually improving.  Please advise.

## 2019-05-20 NOTE — Telephone Encounter (Signed)
Called and advised pt of PCP recommendations. She was ok with Pulmonology referral but declined the omeprazole at this time.

## 2019-05-20 NOTE — Telephone Encounter (Signed)
Patient called in wanting to speak to the office manager in regards to her virtual appt she had with Dr. Beverely Low on 12.21.20. States that she has called our billing department, who told her to call her insurance, who told her to call our office and speak to our Print production planner. Informed patient that our office manager was not in our office today, but that I would take a message and route it to her.   States that she was requesting to have Dr. Beverely Low to order a chest xray for ongoing chest congestion. States she was informed that PCP had to have an appt with her, that she could not just order a chest xray. Due to chest congestion and other COVID symptoms, even though her most recent COVID test was negative, she was told her appt had to be a virtual appt. PCP placed patient on steroids, which never resolved her chest congestion, still dealing with issues currently.   States that she was on hold for over 15 mins waiting for PCP to join [employer] and that the visit lasted all of 10 mins total. She received a bill in the mail from insurance for $260 for a 30-39 min visit. Patient has private insurance and is wanting to know if this appt can be reassessed and be billed with the appropriate visit time that she had with PCP.   Please advise

## 2019-05-20 NOTE — Telephone Encounter (Signed)
The visit was billed not based on time but medical decision making- reviewed 2 previous E-Visits, prior treatments, medications she was currently on, and a medication was prescribed.  This is how medical coding/billing is done and rarely does it involve appt time.  I am also sorry to hear that pt is still struggling w/ symptoms b/c we discussed in our visit if sxs didn't improve, she was to let me know so CXR could be ordered based on the clinical information I obtained during the visit and the failed course of steroids.

## 2019-06-16 ENCOUNTER — Ambulatory Visit (INDEPENDENT_AMBULATORY_CARE_PROVIDER_SITE_OTHER): Payer: 59 | Admitting: Pulmonary Disease

## 2019-06-16 ENCOUNTER — Ambulatory Visit (INDEPENDENT_AMBULATORY_CARE_PROVIDER_SITE_OTHER): Payer: 59

## 2019-06-16 ENCOUNTER — Encounter: Payer: Self-pay | Admitting: Pulmonary Disease

## 2019-06-16 ENCOUNTER — Other Ambulatory Visit: Payer: Self-pay

## 2019-06-16 VITALS — BP 106/66 | HR 78 | Temp 97.6°F | Ht 63.0 in | Wt 157.6 lb

## 2019-06-16 DIAGNOSIS — R0600 Dyspnea, unspecified: Secondary | ICD-10-CM | POA: Diagnosis not present

## 2019-06-16 NOTE — Patient Instructions (Signed)
Lingering cough following a recent respiratory tract infection History of allergies  Continue using Flonase, Allegra, rescue inhaler can be started as needed  We will obtain a chest x-ray Order a breathing study Respiratory allergy panel  I will see you in about 3 months  We can consider Singulair-modifies how you respond to triggers  Pay attention to known triggers and avoid as possible  Call with significant concerns

## 2019-06-16 NOTE — Progress Notes (Signed)
Subjective:    Patient ID: Rachael Chandler, female    DOB: 1980-07-22, 39 y.o.   MRN: [id number]  Patient with a lingering cough  Respiratory tract infection in December Had similar exacerbation about a year ago  Exposure to secondhand smoke that was significant growing up and also worked in a bar at some point  Never smoked  Symptoms of cough, mucus production for which she used a course of antibiotics Allergies Similar symptoms about a year ago  Now with lingering cough, some shortness of breath with activity months chest x-ray PFT and then allergy panel of blood  Currently does use Allegra, Flonase  Has no underlying lung disease known to her  Past Medical History:  Diagnosis Date  . Allergic rhinitis   . Anxiety   . Family history of liver cancer   . Family history of prostate cancer   . Family history of stomach cancer   . GERD (gastroesophageal reflux disease)   . Headache(784.0)    otc meds prn  . 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 Kidney stones     x 1  h/o  . Postpartum care following cesarean delivery (3/15) 07/05/2014  . Sciatica of left side    in past  . Ulcer    clinical dx; no Endoscopy   Review of Systems  Constitutional: Negative for fever and unexpected weight change.  HENT: Negative for congestion, dental problem, ear pain, nosebleeds, postnasal drip, rhinorrhea, sinus pressure, sneezing, sore throat and trouble swallowing.   Eyes: Negative for redness and itching.  Respiratory: Positive for cough and chest tightness. Negative for shortness of breath and wheezing.   Cardiovascular: Negative for palpitations and leg swelling.  Gastrointestinal: Negative for nausea and vomiting.  Genitourinary: Negative for dysuria.  Musculoskeletal: Negative for joint swelling.  Skin: Negative for rash.  Allergic/Immunologic: Negative.  Negative for environmental allergies, food allergies and immunocompromised  state.  Neurological: Negative for headaches.  Hematological: Does not bruise/bleed easily.  Psychiatric/Behavioral: Negative for dysphoric mood. The patient is not nervous/anxious.       Objective:   Physical Exam Constitutional:      Appearance: Normal appearance.  HENT:     Head: Normocephalic.     Nose: Nose normal.     Mouth/Throat:     Mouth: Mucous membranes are moist.  Eyes:     Extraocular Movements: Extraocular movements intact.     Pupils: Pupils are equal, round, and reactive to light.  Cardiovascular:     Rate and Rhythm: Normal rate and regular rhythm.     Pulses: Normal pulses.     Heart sounds: No murmur. No friction rub.  Pulmonary:     Effort: Pulmonary effort is normal. No respiratory distress.     Breath sounds: Normal breath sounds. No stridor. No wheezing or rhonchi.  Musculoskeletal:        General: Normal range of motion.     Cervical back: Normal range of motion and neck supple.  Skin:    General: Skin is warm.  Neurological:     General: No focal deficit present.     Mental Status: She is alert.  Psychiatric:        Mood and Affect: Mood normal.    Vitals:   06/16/19 1448  BP: 106/66  Pulse: 78  Temp: 97.6 F (36.4 C)  SpO2: 97%      Assessment & Plan:  .  Protracted cough following upper respiratory infection .  History of allergies -Currently on Flonase, Allegra  .  Shortness of breath with moderate exertion  Symptoms likely related to lingering respiratory infection, symptoms are mildly better but still has shortness of breath with activity  .  We will obtain a chest x-ray .  Obtain a pulmonary function test. . Respiratory allergy panel  .  Albuterol as needed for shortness of breath/wheezing  .  Avoid triggers as best as possible  .  Consideration for addition of Singulair for allergies  .  We will follow-up in 3 months

## 2019-06-17 ENCOUNTER — Encounter: Payer: Self-pay | Admitting: Pulmonary Disease

## 2019-06-17 LAB — RESPIRATORY ALLERGY PROFILE REGION II ~~LOC~~
Allergen, A. alternata, m6: <0.1 kU/L
Allergen, Cedar tree, t12: <0.1 kU/L
Allergen, Comm Silver Birch, t9: 0.2 kU/L — ABNORMAL HIGH
Allergen, Cottonwood, t14: <0.1 kU/L
Allergen, D pternoyssinus,d7: 7.31 kU/L — ABNORMAL HIGH
Allergen, Mouse Urine Protein, e78: <0.1 kU/L
Allergen, Mulberry, t76: <0.1 kU/L
Allergen, Oak,t7: 0.69 kU/L — ABNORMAL HIGH
Allergen, P. notatum, m1: <0.1 kU/L
Aspergillus fumigatus, m3: <0.1 kU/L
Bermuda Grass: <0.1 kU/L
Box Elder IgE: 0.26 kU/L — ABNORMAL HIGH
CLADOSPORIUM HERBARUM (M2) IGE: <0.1 kU/L
COMMON RAGWEED (SHORT) (W1) IGE: 3.79 kU/L — ABNORMAL HIGH
Cat Dander: <0.1 kU/L
Class: 0
Class: 0
Class: 0
Class: 0
Class: 0
Class: 0
Class: 0
Class: 0
Class: 0
Class: 0
Class: 0
Class: 0
Class: 0
Class: 0
Class: 0
Class: 0
Class: 1
Class: 1
Class: 3
Class: 3
Class: 3
Cockroach: 0.14 kU/L — ABNORMAL HIGH
D. farinae: 7.13 kU/L — ABNORMAL HIGH
Dog Dander: 0.45 kU/L — ABNORMAL HIGH
Elm IgE: <0.1 kU/L
IgE (Immunoglobulin E), Serum: 150 kU/L — ABNORMAL HIGH (ref <=114)
Johnson Grass: <0.1 kU/L
Pecan/Hickory Tree IgE: <0.1 kU/L
Rough Pigweed  IgE: <0.1 kU/L
Sheep Sorrel IgE: <0.1 kU/L
Timothy Grass: <0.1 kU/L

## 2019-06-17 LAB — INTERPRETATION:

## 2019-06-28 ENCOUNTER — Other Ambulatory Visit: Payer: Self-pay

## 2019-06-28 ENCOUNTER — Telehealth: Payer: Self-pay | Admitting: Pulmonary Disease

## 2019-06-28 NOTE — Telephone Encounter (Signed)
Multiple allergens noted with the allergy testing  Dog dander, cockroach, silver birch, oak in a couple of hours  Control exposure to triggers as best as possible

## 2019-06-29 NOTE — Telephone Encounter (Signed)
Okay to cancel PFT

## 2019-06-29 NOTE — Telephone Encounter (Signed)
Called and spoke with Patient.  Dr.Olalere's results and recommendations given.  Understanding stated. Patient stated she saw cxr results on my chart as normal. Patient requested to cancel PFT scheduled for 08/31/19.  Message routed to Dr.Olalere to advise on PFT

## 2019-06-29 NOTE — Telephone Encounter (Signed)
LMOM for pt that appt for covid screening and PFT have been cancelled.

## 2019-07-15 ENCOUNTER — Ambulatory Visit: Payer: 59 | Attending: Internal Medicine

## 2019-07-15 DIAGNOSIS — Z23 Encounter for immunization: Secondary | ICD-10-CM

## 2019-07-15 NOTE — Progress Notes (Signed)
   Covid-19 Vaccination Clinic  Name:  Rachael Chandler    MRN: [id number] DOB: [birth year]  07/15/2019  [name] was observed post Covid-19 immunization for 15 minutes without incident. She was provided with Vaccine Information Sheet and instruction to access the V-Safe system.   [name] was instructed to call 911 with any severe reactions post vaccine: Marland Kitchen Difficulty breathing  . Swelling of face and throat  . A fast heartbeat  . A bad rash all over body  . Dizziness and weakness   Immunizations Administered    Name Date Dose VIS Date Route   Pfizer COVID-19 Vaccine 07/15/2019  4:16 PM 0.3 mL 04/02/2019 Intramuscular   Manufacturer: [employer], [employer]   Lot: [id number]   NDC: [address]-5

## 2019-07-19 ENCOUNTER — Other Ambulatory Visit: Payer: Self-pay

## 2019-07-19 ENCOUNTER — Emergency Department (HOSPITAL_COMMUNITY)
Admission: EM | Admit: 2019-07-19 | Discharge: 2019-07-19 | Disposition: A | Discharge status: Home / Self Care | Payer: 59 | Attending: Emergency Medicine | Admitting: Emergency Medicine

## 2019-07-19 ENCOUNTER — Encounter (HOSPITAL_COMMUNITY): Payer: Self-pay

## 2019-07-19 ENCOUNTER — Emergency Department (HOSPITAL_COMMUNITY): Payer: 59

## 2019-07-19 DIAGNOSIS — R0789 Other chest pain: Secondary | ICD-10-CM | POA: Diagnosis not present

## 2019-07-19 DIAGNOSIS — R110 Nausea: Secondary | ICD-10-CM | POA: Insufficient documentation

## 2019-07-19 DIAGNOSIS — Z79899 Other long term (current) drug therapy: Secondary | ICD-10-CM | POA: Diagnosis not present

## 2019-07-19 LAB — BASIC METABOLIC PANEL
Anion gap: 10 (ref 5–15)
BUN: 12 mg/dL (ref 6–20)
CO2: 24 mmol/L (ref 22–32)
Calcium: 9.5 mg/dL (ref 8.9–10.3)
Chloride: 108 mmol/L (ref 98–111)
Creatinine, Ser: 0.75 mg/dL (ref 0.44–1.00)
GFR calc Af Amer: >60 mL/min (ref >60)
GFR calc non Af Amer: >60 mL/min (ref >60)
Glucose, Bld: 87 mg/dL (ref 70–99)
Potassium: 3.8 mmol/L (ref 3.5–5.1)
Sodium: 142 mmol/L (ref 135–145)

## 2019-07-19 LAB — CBC
HCT: 41.2 % (ref 36.0–46.0)
Hemoglobin: 13.6 g/dL (ref 12.0–15.0)
MCH: 31.1 pg (ref 26.0–34.0)
MCHC: 33 g/dL (ref 30.0–36.0)
MCV: 94.3 fL (ref 80.0–100.0)
Platelets: 139 10*3/uL — ABNORMAL LOW (ref 150–400)
RBC: 4.37 MIL/uL (ref 3.87–5.11)
RDW: 13.8 % (ref 11.5–15.5)
WBC: 4.6 10*3/uL (ref 4.0–10.5)
nRBC: 0 % (ref 0.0–0.2)

## 2019-07-19 LAB — LIPASE, BLOOD: Lipase: 27 U/L (ref 11–51)

## 2019-07-19 LAB — HCG, QUANTITATIVE, PREGNANCY: hCG, Beta Chain, Quant, S: <1 m[IU]/mL (ref <5)

## 2019-07-19 LAB — HEPATIC FUNCTION PANEL
ALT: 20 U/L (ref 0–44)
AST: 20 U/L (ref 15–41)
Albumin: 4.1 g/dL (ref 3.5–5.0)
Alkaline Phosphatase: 47 U/L (ref 38–126)
Bilirubin, Direct: 0.3 mg/dL — ABNORMAL HIGH (ref 0.0–0.2)
Indirect Bilirubin: 1.3 mg/dL — ABNORMAL HIGH (ref 0.3–0.9)
Total Bilirubin: 1.6 mg/dL — ABNORMAL HIGH (ref 0.3–1.2)
Total Protein: 7.3 g/dL (ref 6.5–8.1)

## 2019-07-19 LAB — TROPONIN I (HIGH SENSITIVITY): Troponin I (High Sensitivity): <2 ng/L (ref <18)

## 2019-07-19 MED ORDER — DICYCLOMINE HCL 10 MG/5ML PO SOLN: 10.0000 mg | Freq: Once | ORAL | Status: DC

## 2019-07-19 MED ORDER — SODIUM CHLORIDE 0.9% FLUSH: 3.0000 mL | Freq: Once | INTRAVENOUS | Status: DC

## 2019-07-19 MED ORDER — LIDOCAINE VISCOUS HCL 2 % MT SOLN
15.0000 mL | Freq: Once | OROMUCOSAL | Status: DC
  Filled 2019-07-19: qty 15

## 2019-07-19 MED ORDER — ALUM & MAG HYDROXIDE-SIMETH 200-200-20 MG/5ML PO SUSP
30.0000 mL | Freq: Once | ORAL | Status: DC
  Filled 2019-07-19: qty 30

## 2019-07-19 MED ORDER — FAMOTIDINE 20 MG PO TABS
40.0000 mg | ORAL_TABLET | Freq: Once | ORAL | Status: DC
  Filled 2019-07-19: qty 2

## 2019-07-19 MED ORDER — DICYCLOMINE HCL 10 MG PO CAPS: 10.0000 mg | ORAL_CAPSULE | Freq: Once | ORAL | Status: DC

## 2019-07-19 NOTE — ED Notes (Signed)
Pt states that she is feeling better than when she arrived, and would like d/c. Pt requesting to not have 2nd trop drawn. Dr Freida Busman made aware.

## 2019-07-19 NOTE — ED Provider Notes (Signed)
[facility]-EMERGENCY DEPT Provider Note   CSN: [id number] Arrival date & time: 07/19/19  1014     History Chief Complaint  Patient presents with  . Chest Pain    Rachael Chandler is a 39 y.o. female.  40 year old female who presents with chest tightness rating to her mid back x3 days.  Has had nausea but no vomiting.  No fever or chills.  No cough.  No leg pain or swelling.  Denies any dyspnea.  Pain is worse at times with certain positions.  Has had trouble finding a spot that is comfortable.  No palpitations or syncope        Past Medical History:  Diagnosis Date  . Allergic rhinitis   . Anxiety   . Family history of liver cancer   . Family history of prostate cancer   . Family history of stomach cancer   . GERD (gastroesophageal reflux disease)   . Headache(784.0)    otc meds prn  . 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 Kidney stones     x 1  h/o  . Postpartum care following cesarean delivery (3/15) 07/05/2014  . Sciatica of left side    in past  . Ulcer    clinical dx; no Endoscopy    Patient Active Problem List   Diagnosis Date Noted  . Genetic testing 03/25/2018  . Family history of stomach cancer   . Family history of liver cancer   . Family history of prostate cancer   . Vitamin D deficiency 02/04/2018  . Allergic rhinitis 11/28/2017  . Breast lump 04/04/2017  . Physical exam 08/18/2015  . Chest pain 08/18/2015  . Epistaxis, recurrent 05/12/2014    Past Surgical History:  Procedure Laterality Date  . cautherization    . CESAREAN SECTION N/A 07/05/2014   Procedure: CESAREAN SECTION;  Surgeon: Olivia Mackie, MD;  Location: [facility] ORS;  Service: Obstetrics;  Laterality: N/A;  . CHOLECYSTECTOMY N/A 11/30/2018   Procedure: LAPAROSCOPIC CHOLECYSTECTOMY;  Surgeon: Axel Filler, MD;  Location: [facility];  Service: General;  Laterality: N/A;  . DILATATION & CURRETTAGE/HYSTEROSCOPY WITH  RESECTOCOPE  02/26/2012   Procedure: DILATATION & CURETTAGE/HYSTEROSCOPY WITH RESECTOCOPE;  Surgeon: Purcell Nails, MD;  Location: [facility] ORS;  Service: Gynecology;  Laterality: N/A;     . fractured arm  1986   left  . LIVER BIOPSY N/A 11/30/2018   Procedure: Liver Biopsy;  Surgeon: Axel Filler, MD;  Location: [facility] OR;  Service: General;  Laterality: N/A;  . WISDOM TOOTH EXTRACTION       OB History    Gravida  1   Para  1   Term  1   Preterm      AB      Living  1     SAB      TAB      Ectopic      Multiple  0   Live Births  1           Family History  Problem Relation Age of Onset  . Alcohol abuse Father   . Cancer Father        liver, d.54  . Alcohol abuse Mother   . Sudden death Mother        bleeding internal and could not do surgery because of stomach cancer  . Stomach cancer Mother 12  . Alcohol abuse Maternal Grandmother   . Hyperlipidemia Maternal Grandmother   . Hypertension Maternal Grandmother   .  Diabetes Maternal Grandmother   . Renal Disease Maternal Grandmother   . Heart disease Paternal Grandmother   . Prostate cancer Maternal Uncle   . Cancer Paternal Uncle        unk type    Social History   Tobacco Use  . Smoking status: Never Smoker  . Smokeless tobacco: Never Used  Substance Use Topics  . Alcohol use: Yes    Comment: social  . Drug use: No    Home Medications Prior to Admission medications   Medication Sig Start Date End Date Taking? Authorizing Provider  acetaminophen (TYLENOL) 325 MG tablet Take 650 mg by mouth every 6 (six) hours as needed for moderate pain or headache.    [provider]  albuterol (VENTOLIN HFA) 108 (90 Base) MCG/ACT inhaler Inhale 2 puffs into the lungs every 4 (four) hours as needed for wheezing or shortness of breath (cough, shortness of breath or wheezing.). 04/05/19   McVey, Gelene Mink, PA-C  calcium carbonate (TUMS - DOSED IN MG ELEMENTAL CALCIUM) 500 MG chewable tablet Chew 4  tablets by mouth 2 (two) times daily as needed for indigestion or heartburn.    [provider]  famotidine (PEPCID) 20 MG tablet Take 1 tablet (20 mg total) by mouth 2 (two) times daily. 10/02/18   Orpah Greek, MD  fexofenadine (ALLEGRA) 180 MG tablet Take 180 mg by mouth daily as needed for allergies or rhinitis.    [provider]  fluticasone (FLONASE) 50 MCG/ACT nasal spray Place 2 sprays into both nostrils daily. 04/20/19   Midge Minium, MD  ibuprofen (ADVIL) 200 MG tablet Take 400 mg by mouth every 6 (six) hours as needed for headache or moderate pain.    [provider]    Allergies    Patient has no known allergies.  Review of Systems   Review of Systems  All other systems reviewed and are negative.   Physical Exam Updated Vital Signs BP 113/86 (BP Location: Left Arm)   Pulse 80   Temp 98.3 F (36.8 C) (Oral)   Resp 16   Ht 1.6 m (5\' 3" )   Wt 68.9 kg   LMP 06/28/2019   SpO2 98%   BMI 26.93 kg/m   Physical Exam Vitals and nursing note reviewed.  Constitutional:      General: She is not in acute distress.    Appearance: Normal appearance. She is well-developed. She is not toxic-appearing.  HENT:     Head: Normocephalic and atraumatic.  Eyes:     General: Lids are normal.     Conjunctiva/sclera: Conjunctivae normal.     Pupils: Pupils are equal, round, and reactive to light.  Neck:     Thyroid: No thyroid mass.     Trachea: No tracheal deviation.  Cardiovascular:     Rate and Rhythm: Normal rate and regular rhythm.     Heart sounds: Normal heart sounds. No murmur. No gallop.   Pulmonary:     Effort: Pulmonary effort is normal. No respiratory distress.     Breath sounds: Normal breath sounds. No stridor. No decreased breath sounds, wheezing, rhonchi or rales.  Abdominal:     General: Bowel sounds are normal. There is no distension.     Palpations: Abdomen is soft.     Tenderness: There is no abdominal tenderness.  There is no rebound.  Musculoskeletal:        General: No tenderness. Normal range of motion.     Cervical back: Normal  range of motion and neck supple.  Skin:    General: Skin is warm and dry.     Findings: No abrasion or rash.  Neurological:     Mental Status: She is alert and oriented to person, place, and time.     GCS: GCS eye subscore is 4. GCS verbal subscore is 5. GCS motor subscore is 6.     Cranial Nerves: No cranial nerve deficit.     Sensory: No sensory deficit.  Psychiatric:        Speech: Speech normal.        Behavior: Behavior normal.     ED Results / Procedures / Treatments   Labs (all labs ordered are listed, but only abnormal results are displayed) Labs Reviewed  CBC - Abnormal; Notable for the following components:      Result Value   Platelets 139 (*)    All other components within normal limits  BASIC METABOLIC PANEL  HCG, QUANTITATIVE, PREGNANCY  LIPASE, BLOOD  HEPATIC FUNCTION PANEL  POC URINE PREG, ED  TROPONIN I (HIGH SENSITIVITY)    EKG EKG Interpretation  Date/Time:  Monday July 19 2019 10:24:46 EDT Ventricular Rate:  97 PR Interval:    QRS Duration: 77 QT Interval:  346 QTC Calculation: 440 R Axis:   78 Text Interpretation: Sinus rhythm Consider right atrial enlargement Low voltage, precordial leads Baseline wander in lead(s) V5 12 Lead; Mason-Likar No significant change since last tracing Confirmed by Lorre Nick ([id number]) on 07/19/2019 11:27:30 AM   Radiology DG Chest 2 View  Result Date: 07/19/2019 CLINICAL DATA:  Chest pain and mid back pain. EXAM: CHEST - 2 VIEW COMPARISON:  06/16/2019 FINDINGS: The heart size and mediastinal contours are within normal limits. Both lungs are clear. The visualized skeletal structures are unremarkable. IMPRESSION: Normal exam. Electronically Signed   By: Francene Boyers M.D.   On: 07/19/2019 10:39    Procedures Procedures (including critical care time)  Medications Ordered in ED Medications   sodium chloride flush (NS) 0.9 % injection 3 mL (has no administration in time range)  famotidine (PEPCID) tablet 40 mg (has no administration in time range)  alum & mag hydroxide-simeth (MAALOX/MYLANTA) 200-200-20 MG/5ML suspension 30 mL (has no administration in time range)    And  lidocaine (XYLOCAINE) 2 % viscous mouth solution 15 mL (has no administration in time range)  dicyclomine (BENTYL) 10 MG/5ML solution 10 mg (has no administration in time range)    ED Course  I have reviewed the triage vital signs and the nursing notes.  Pertinent labs & imaging results that were available during my care of the patient were reviewed by me and considered in my medical decision making (see chart for details).    MDM Rules/Calculators/A&P                      Patient given medication for reflux and feels better.  Will like to go home.  Cardiac work-up here is negative Final Clinical Impression(s) / ED Diagnoses Final diagnoses:  None    Rx / DC Orders ED Discharge Orders    None       Lorre Nick, MD 07/19/19 1358

## 2019-07-19 NOTE — ED Triage Notes (Signed)
Patient c/o intermittent mid chest pain that radiates into the mid back x 3 days. Patient states she had some nausea when CP first started, but none today.

## 2019-07-19 NOTE — ED Notes (Signed)
Pt refuses IV.   

## 2019-08-09 ENCOUNTER — Ambulatory Visit: Payer: 59 | Attending: Internal Medicine

## 2019-08-09 ENCOUNTER — Other Ambulatory Visit: Payer: Self-pay | Admitting: General Practice

## 2019-08-09 ENCOUNTER — Telehealth: Payer: Self-pay | Admitting: Family Medicine

## 2019-08-09 DIAGNOSIS — Z23 Encounter for immunization: Secondary | ICD-10-CM

## 2019-08-09 NOTE — Telephone Encounter (Signed)
It would be up to her whether she feels up to getting the shot today or not.  There is a pressure change/weather front moving through which can frequently trigger migraines.  If she would feel more comfortable postponing, it shouldn't be nearly as difficult to reschedule as it was before now that there are more shots and appts available

## 2019-08-09 NOTE — Telephone Encounter (Signed)
Please advise 

## 2019-08-09 NOTE — Telephone Encounter (Signed)
Pt called in stating that she is due to get her 2nd Covid shot. This morning she wake up with a headache and is similar to a migraine and she is have some nausea. She wanted to know if she should get her shot today or wait it out. Please advise.

## 2019-08-09 NOTE — Progress Notes (Signed)
   Covid-19 Vaccination Clinic  Name:  Tunisia Landgrebe    MRN: [id number] DOB: 04-19-1981  08/09/2019  Ms. Akel was observed post Covid-19 immunization for 15 minutes without incident. She was provided with Vaccine Information Sheet and instruction to access the V-Safe system.   Ms. Cherian was instructed to call 911 with any severe reactions post vaccine: Marland Kitchen Difficulty breathing  . Swelling of face and throat  . A fast heartbeat  . A bad rash all over body  . Dizziness and weakness   Immunizations Administered    Name Date Dose VIS Date Route   Pfizer COVID-19 Vaccine 08/09/2019  4:02 PM 0.3 mL 06/16/2018 Intramuscular   Manufacturer: [employer], [employer]   Lot: [id number]   NDC: [address]-9

## 2019-08-09 NOTE — Telephone Encounter (Signed)
Patient notified of PCP recommendations and is agreement and expresses an understanding.  

## 2019-08-28 ENCOUNTER — Other Ambulatory Visit (HOSPITAL_COMMUNITY): Payer: 59

## 2020-01-08 ENCOUNTER — Other Ambulatory Visit: Payer: 59

## 2020-04-11 ENCOUNTER — Other Ambulatory Visit: Payer: 59

## 2020-04-11 DIAGNOSIS — Z20822 Contact with and (suspected) exposure to covid-19: Secondary | ICD-10-CM

## 2020-04-12 LAB — NOVEL CORONAVIRUS, NAA: SARS-CoV-2, NAA: NOT DETECTED

## 2020-04-12 LAB — SARS-COV-2, NAA 2 DAY TAT

## 2020-05-02 ENCOUNTER — Other Ambulatory Visit: Payer: 59

## 2020-10-18 ENCOUNTER — Encounter: Payer: Self-pay | Admitting: *Deleted

## 2020-11-09 IMAGING — DX DG CHEST 2V
2 series · 2 of 2 positions shown · non-contrast
Comparison: 03/27/2017

CLINICAL DATA: Shortness of breath.  Dyspnea.

EXAM:
CHEST - 2 VIEW

[chest pa]
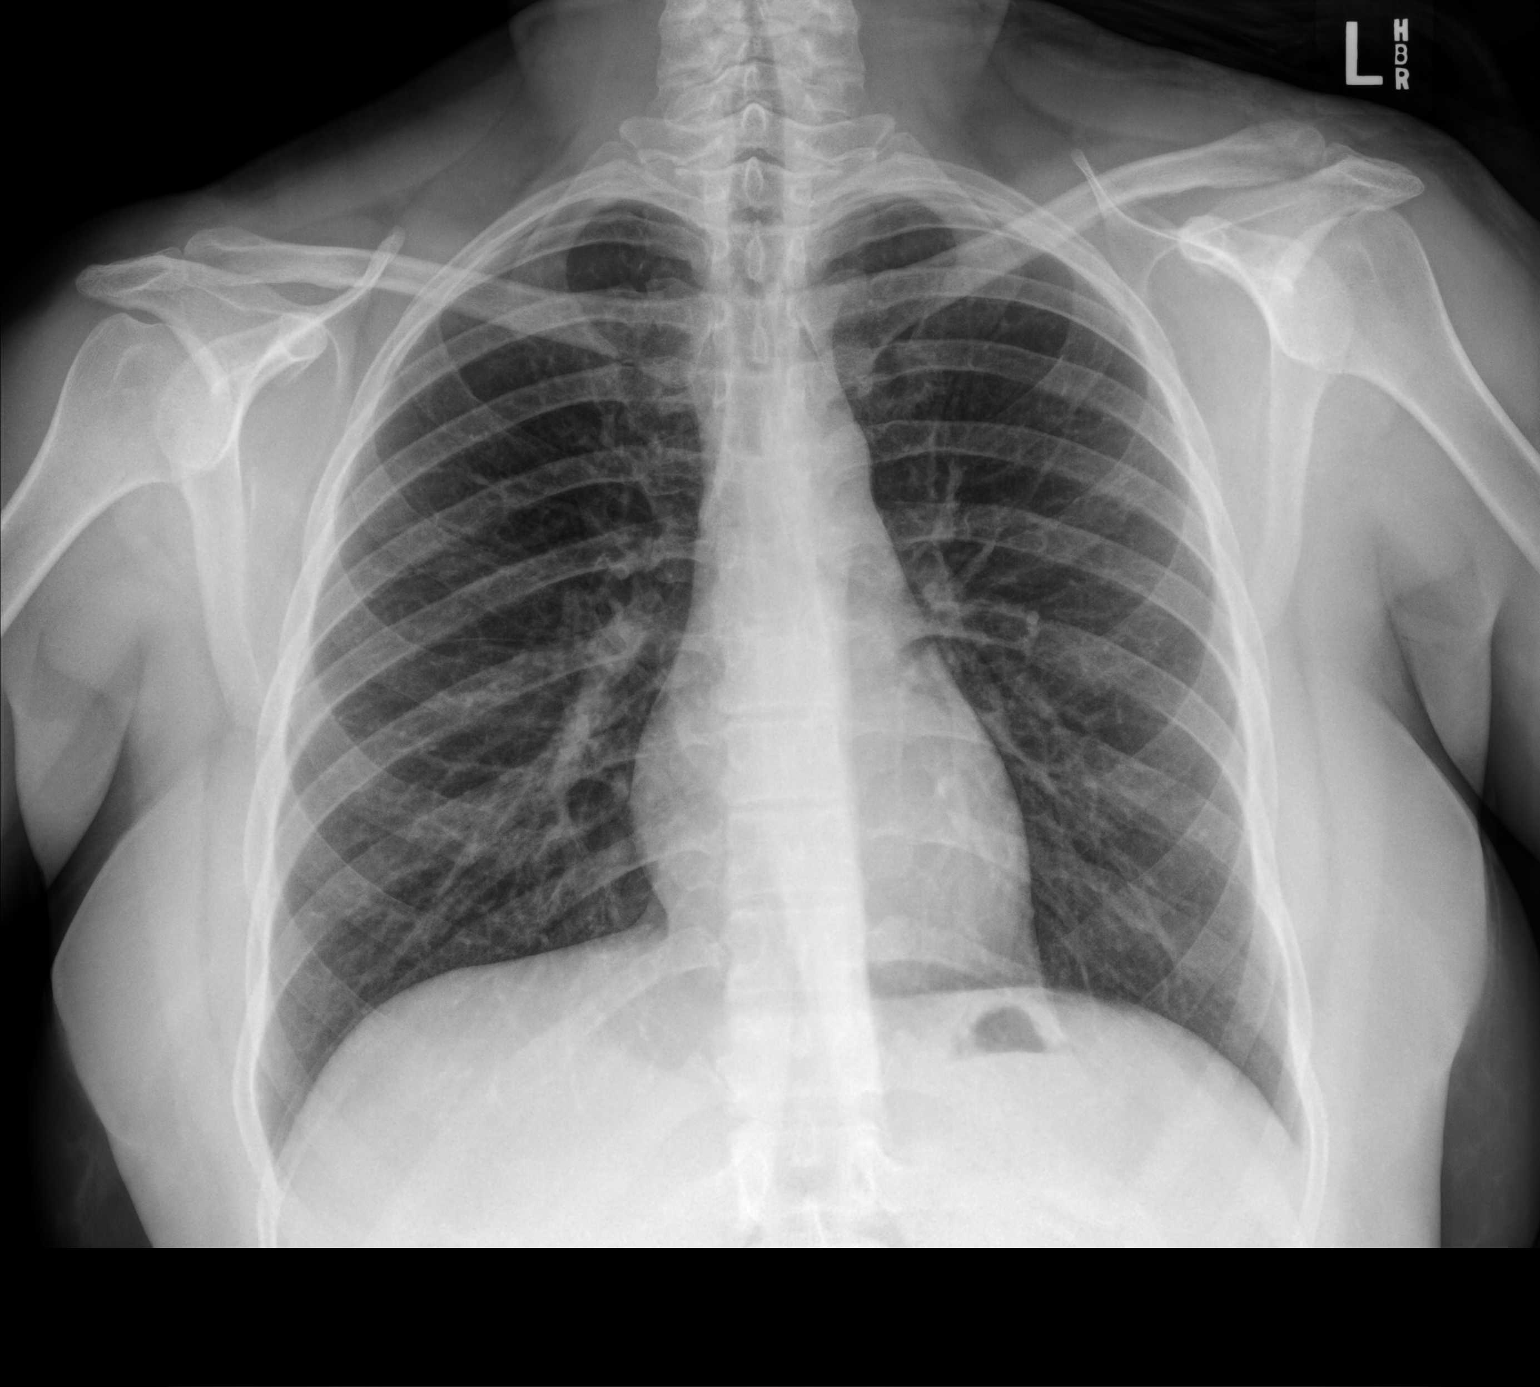

[chest lat]
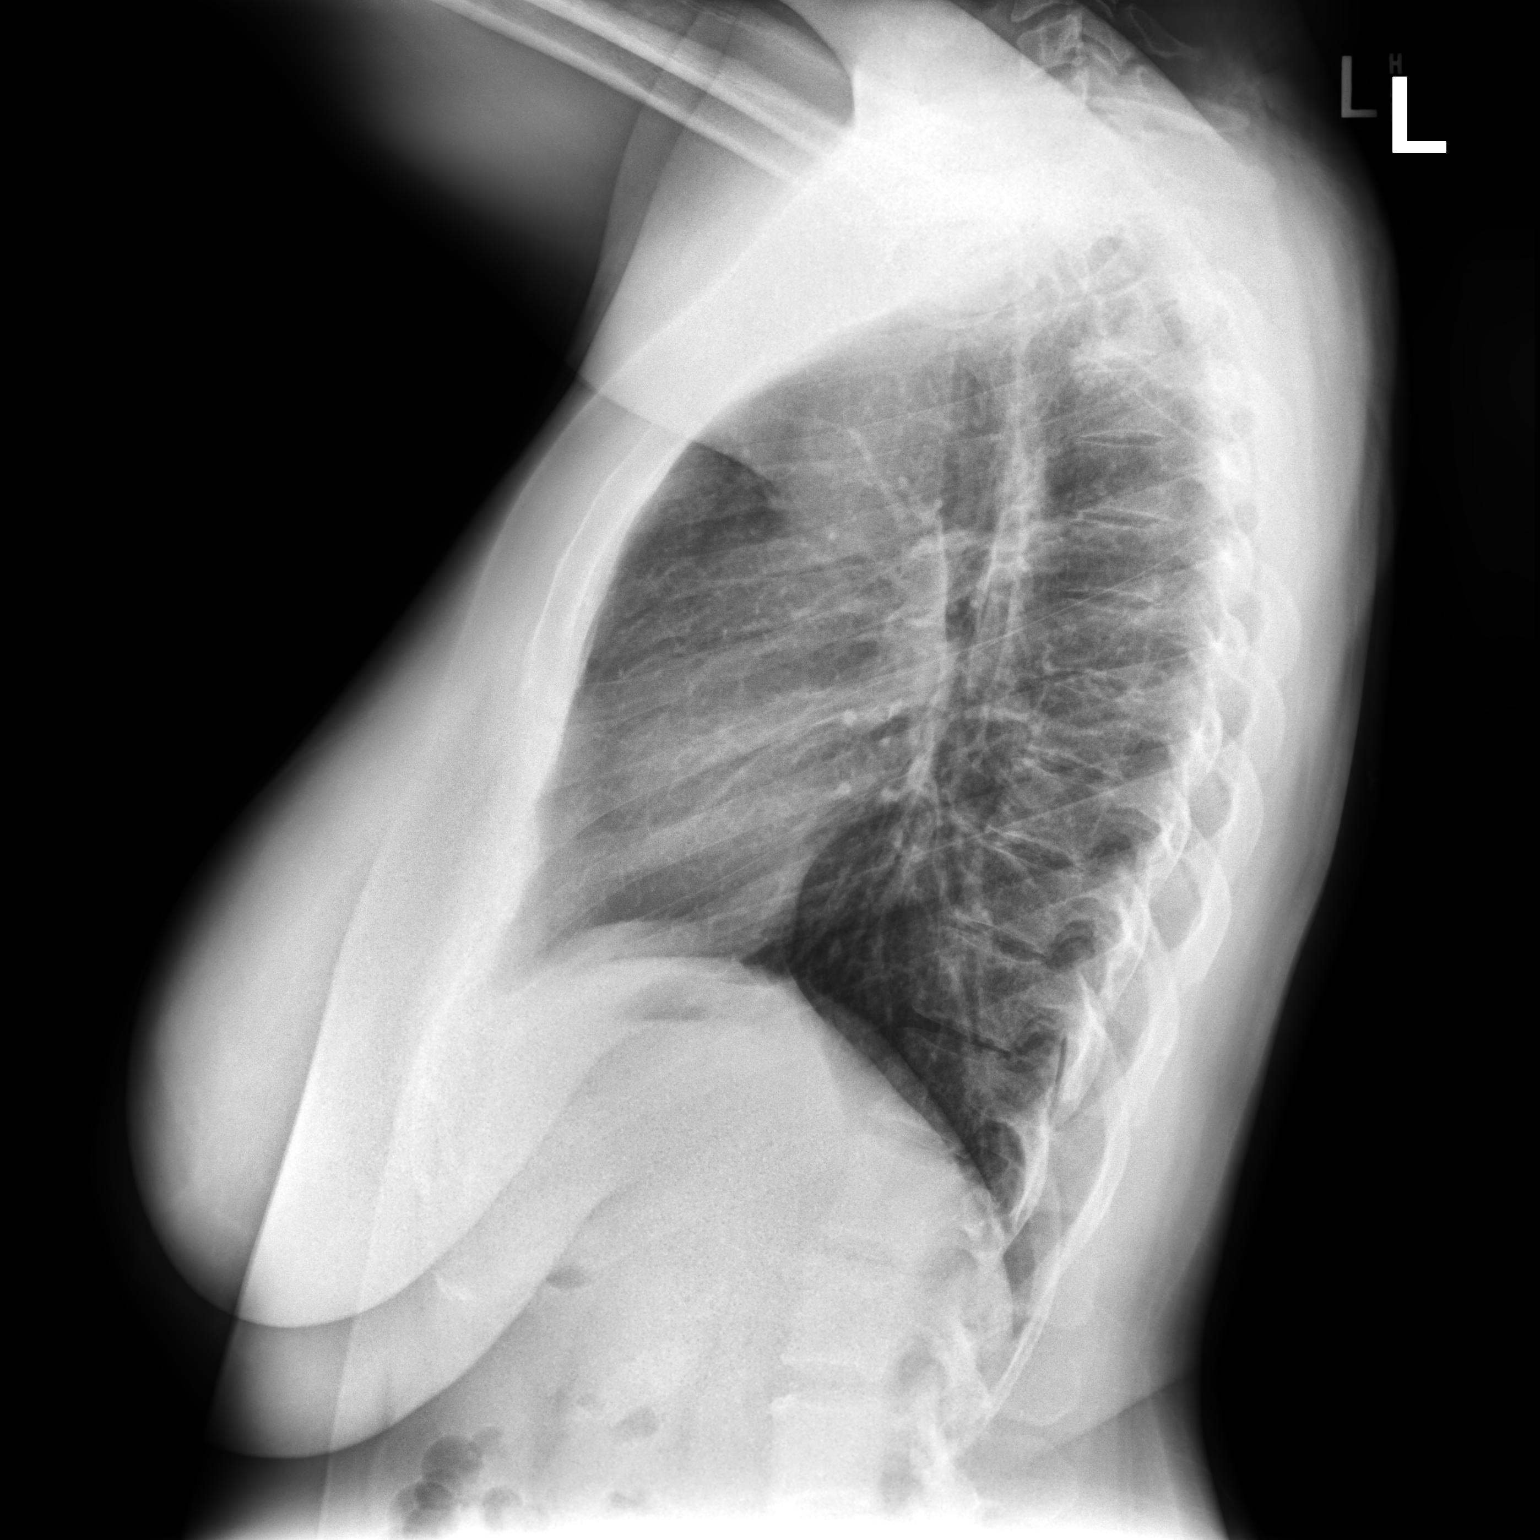

[2 of 2 positions shown; findings below may reference images not displayed]

FINDINGS: The heart size and mediastinal contours are within normal limits.
Both lungs are clear. The visualized skeletal structures are
unremarkable.
IMPRESSION: Normal exam.

## 2020-12-12 IMAGING — CR DG CHEST 2V
2 series · 2 of 2 positions shown · non-contrast
Comparison: 06/16/2019

CLINICAL DATA: Chest pain and mid back pain.

EXAM:
CHEST - 2 VIEW

[w chest pa]
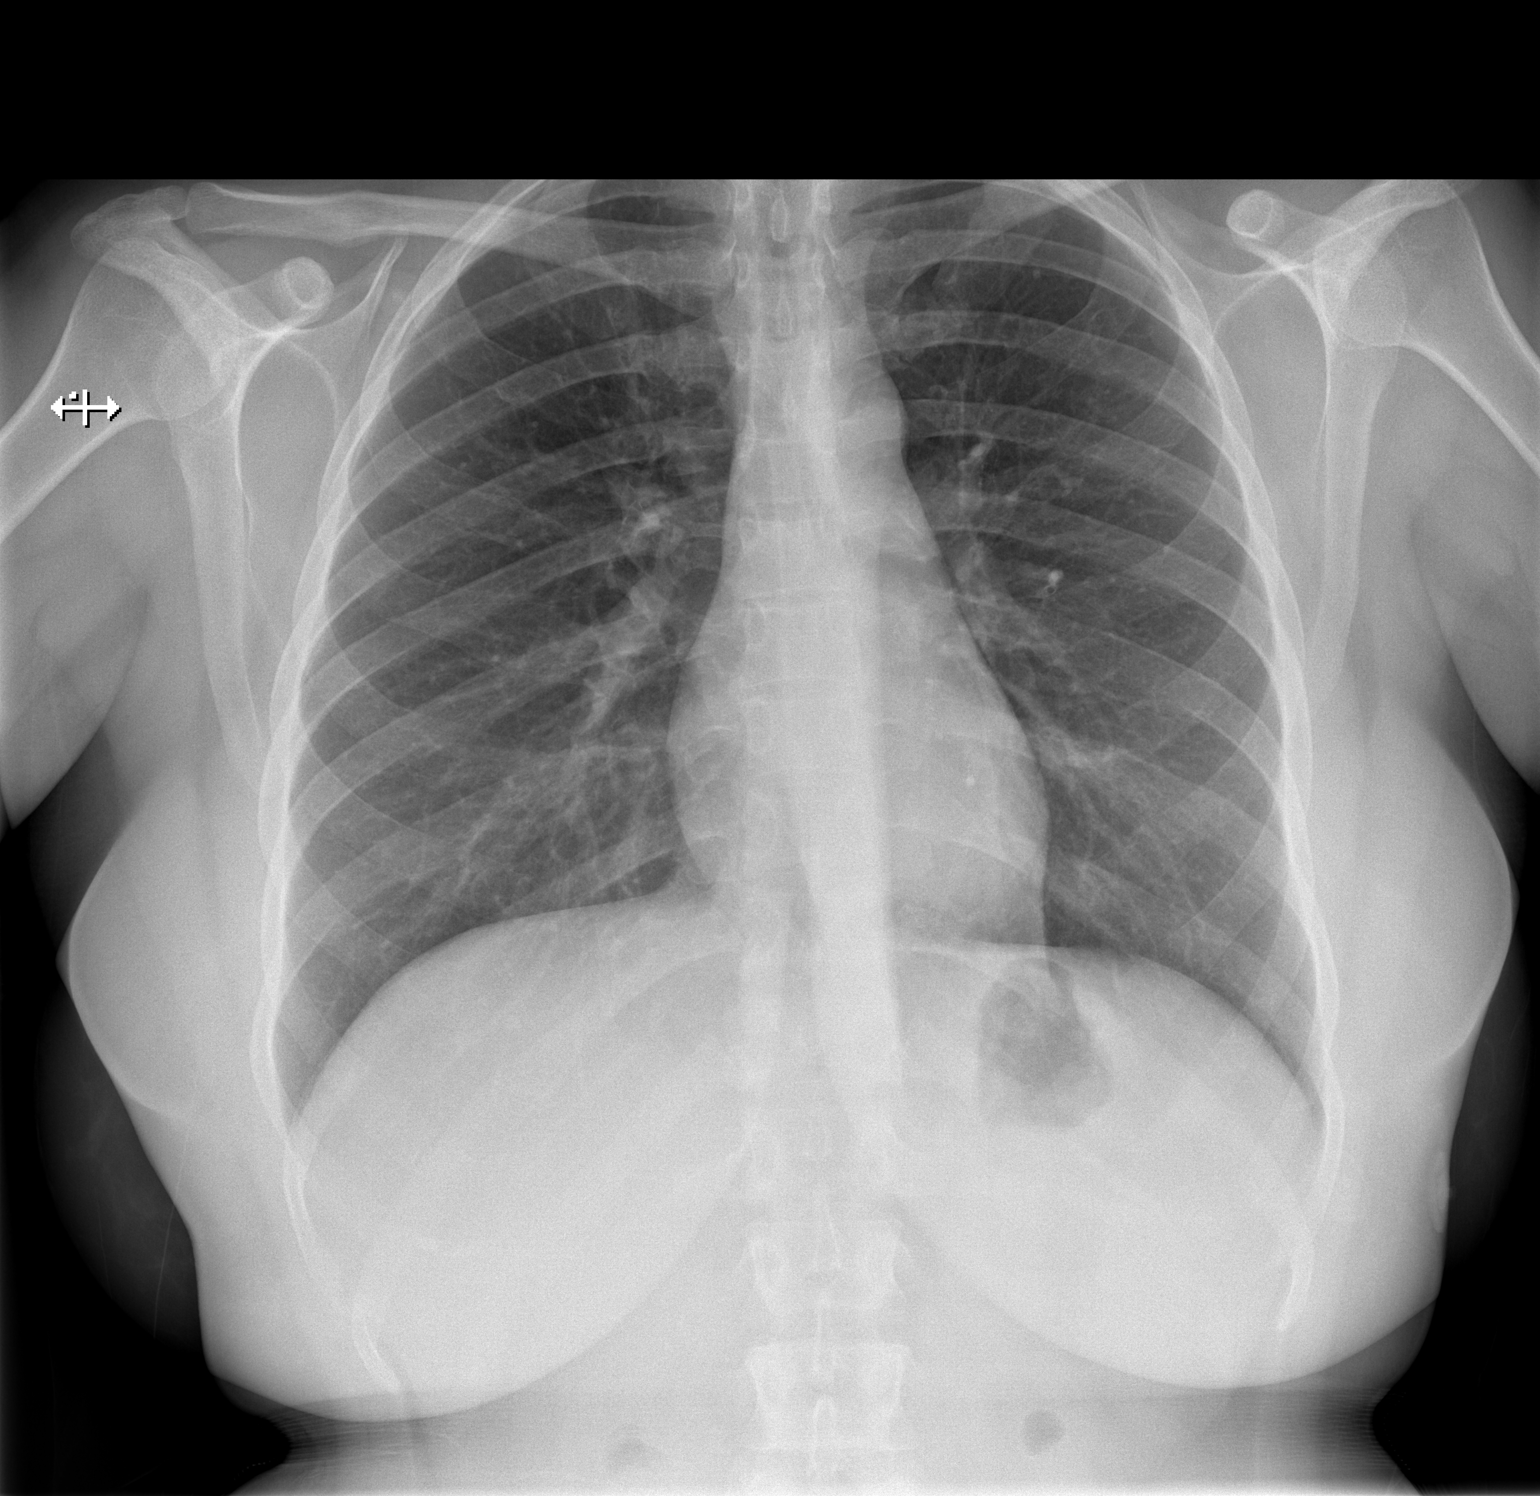

[w chest lat]
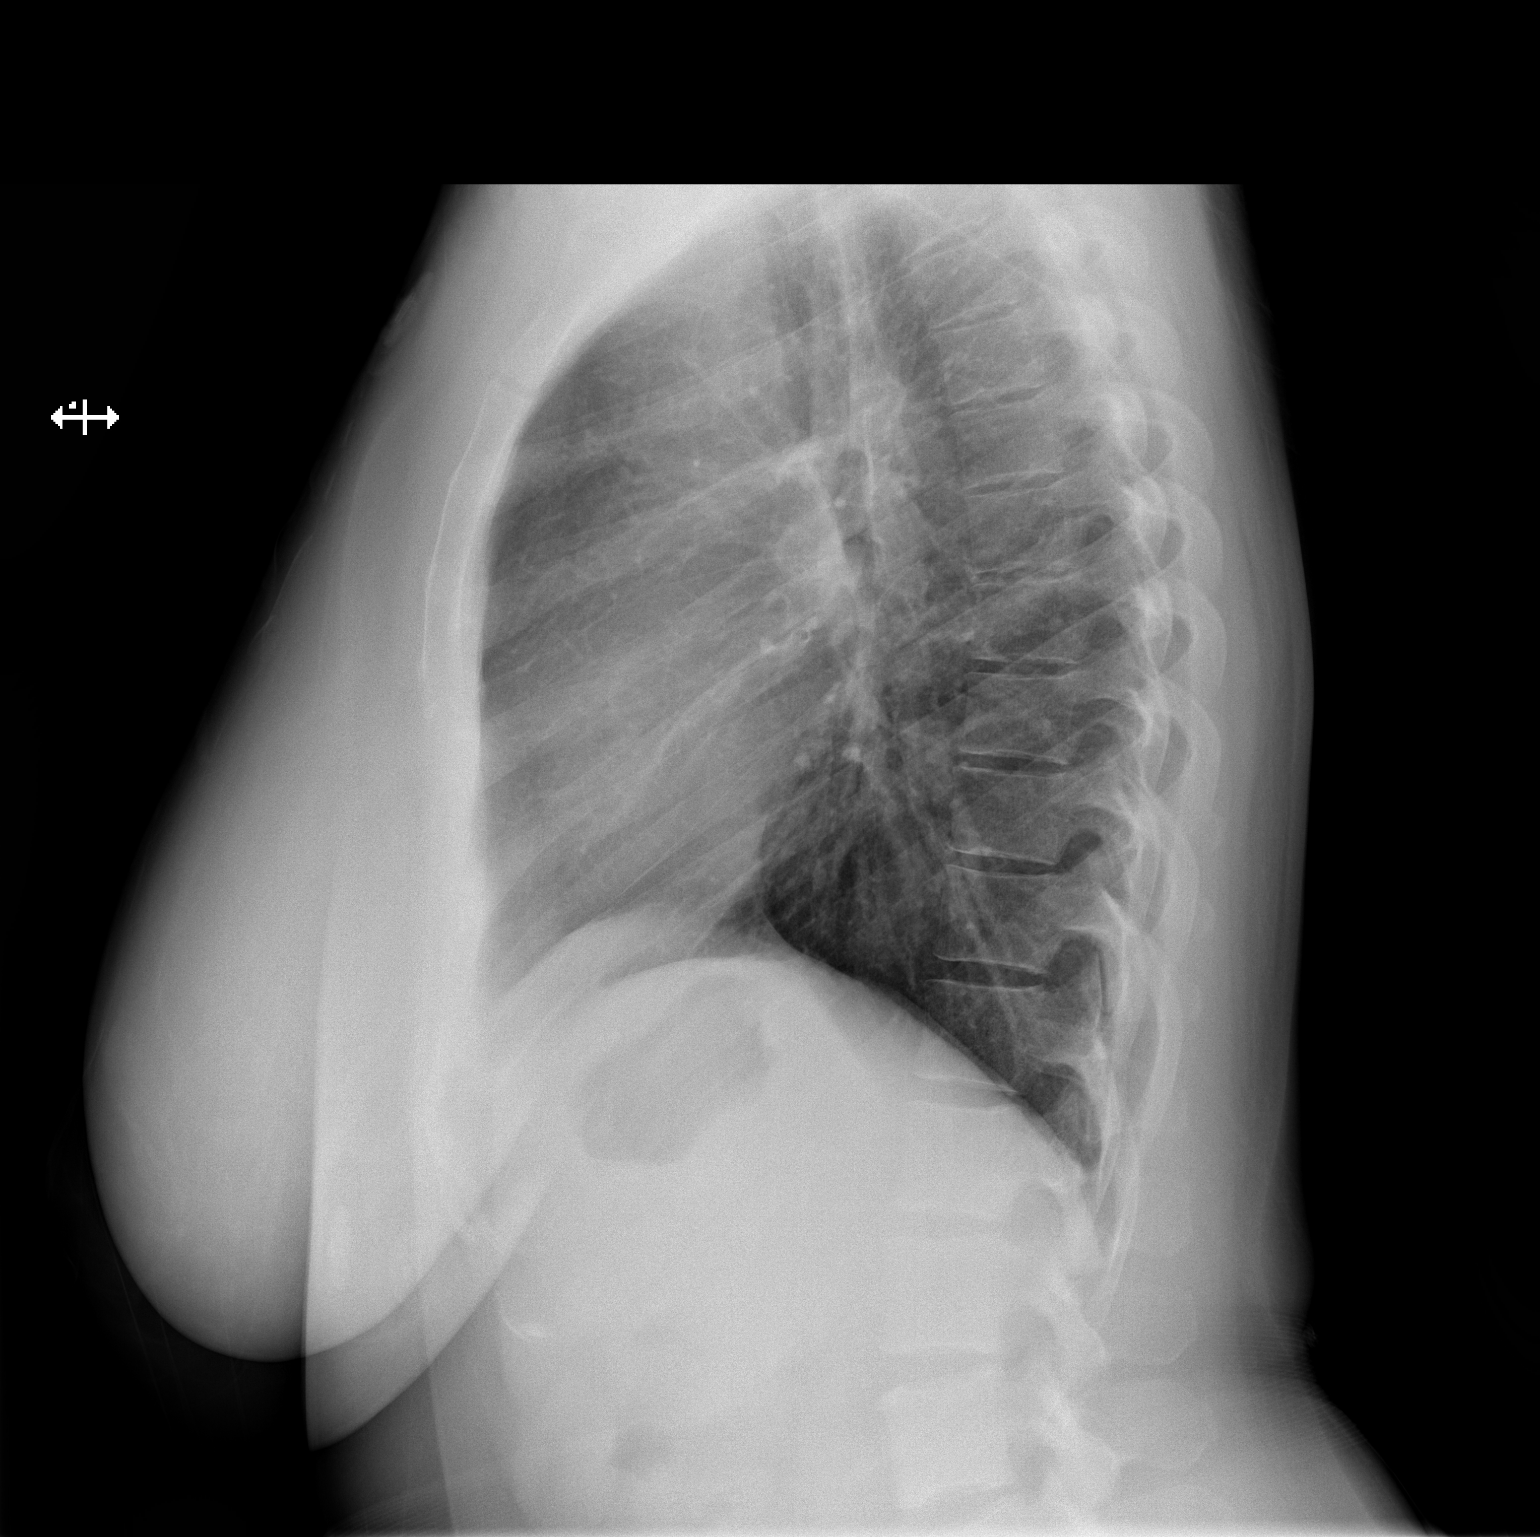

[2 of 2 positions shown; findings below may reference images not displayed]

FINDINGS: The heart size and mediastinal contours are within normal limits.
Both lungs are clear. The visualized skeletal structures are
unremarkable.
IMPRESSION: Normal exam.

## 2021-05-04 ENCOUNTER — Telehealth: Payer: 59 | Admitting: Family

## 2021-05-04 ENCOUNTER — Telehealth: Payer: Self-pay | Admitting: Family Medicine

## 2021-05-04 ENCOUNTER — Encounter: Payer: Self-pay | Admitting: Family

## 2021-05-04 NOTE — Telephone Encounter (Signed)
Pt called in regarding her appt that was scheduled today at 3:30pm with [facility]. She stated she had trouble getting on line. She was on the phone with a CMA but hung up due to being left on hold for 30 mins. Unfortunately she hung up again before I could get a hold of [place] or her CMA

## 2021-05-04 NOTE — Progress Notes (Deleted)
MyChart Video Visit    Virtual Visit via Video Note   This visit type was conducted due to national recommendations for restrictions regarding the COVID-19 Pandemic (e.g. social distancing) in an effort to limit this patient's exposure and mitigate transmission in our community. This patient is at least at moderate risk for complications without adequate follow up. This format is felt to be most appropriate for this patient at this time. Physical exam was limited by quality of the video and audio technology used for the visit. CMA was able to get the patient set up on a video visit.  Patient location: Home. Patient and provider in visit Provider location: Office  I discussed the limitations of evaluation and management by telemedicine and the availability of in person appointments. The patient expressed understanding and agreed to proceed.  Visit Date: 05/04/2021  Today's healthcare provider: Dulce Sellar, NP     Subjective:    Patient ID: Rachael Chandler, female    DOB: 1981-01-03, 41 y.o.   MRN: [id number]  Chief Complaint  Patient presents with   Eye Drainage    Started 3 days ago. Swollen-bilateral, redness She has used cool compresses. She denies itching or pain.    Nasal Congestion    HPI Eye irritation:  Pt complains of ***pain, itching, watery/thick discharge, matting, redness. Pt denies ***pain, itching, discharge, matting, redness. Symptoms started *** days ago. Symptoms are located in *** right/left/both eyes. Pt ***has/has not been exposed to someone with confirmed conjunctivitis. Pt ***does/does not also have other sinus symptoms.  Past Medical History:  Diagnosis Date   Allergic rhinitis    Anxiety    Family history of liver cancer    Family history of prostate cancer    Family history of stomach cancer    GERD (gastroesophageal reflux disease)    Headache(784.0)    otc meds prn   Immune thrombocytopenia affecting pregnancy in third  trimester (HCC) 05/11/2014   Kidney stones     x 1  h/o   Postpartum care following cesarean delivery (3/15) 07/05/2014   Sciatica of left side    in past   Ulcer    clinical dx; no Endoscopy    Past Surgical History:  Procedure Laterality Date   cautherization     CESAREAN SECTION N/A 07/05/2014   Procedure: CESAREAN SECTION;  Surgeon: Olivia Mackie, MD;  Location: [facility] ORS;  Service: Obstetrics;  Laterality: N/A;   CHOLECYSTECTOMY N/A 11/30/2018   Procedure: LAPAROSCOPIC CHOLECYSTECTOMY;  Surgeon: Axel Filler, MD;  Location: [facility] OR;  Service: General;  Laterality: N/A;   DILATATION & CURRETTAGE/HYSTEROSCOPY WITH RESECTOCOPE  02/26/2012   Procedure: DILATATION & CURETTAGE/HYSTEROSCOPY WITH RESECTOCOPE;  Surgeon: Purcell Nails, MD;  Location: [facility] ORS;  Service: Gynecology;  Laterality: N/A;      fractured arm  1986   left   LIVER BIOPSY N/A 11/30/2018   Procedure: Liver Biopsy;  Surgeon: Axel Filler, MD;  Location: [facility] OR;  Service: General;  Laterality: N/A;   WISDOM TOOTH EXTRACTION      Outpatient Medications Prior to Visit  Medication Sig Dispense Refill   fluticasone (FLONASE) 50 MCG/ACT nasal spray Place 2 sprays into both nostrils daily. 16 g 6   acetaminophen (TYLENOL) 325 MG tablet Take 650 mg by mouth every 6 (six) hours as needed for moderate pain or headache.     albuterol (VENTOLIN HFA) 108 (90 Base) MCG/ACT inhaler Inhale 2 puffs into the lungs every 4 (four) hours as needed for wheezing  or shortness of breath (cough, shortness of breath or wheezing.). (Patient not taking: Reported on 07/19/2019) 6.7 g 0   calcium carbonate (TUMS - DOSED IN MG ELEMENTAL CALCIUM) 500 MG chewable tablet Chew 2-4 tablets by mouth 2 (two) times daily as needed for indigestion or heartburn.      fexofenadine (ALLEGRA) 180 MG tablet Take 180 mg by mouth daily.      ibuprofen (ADVIL) 200 MG tablet Take 400 mg by mouth every 6 (six) hours as needed for headache or moderate  pain.     Multiple Vitamin (MULTIVITAMIN WITH MINERALS) TABS tablet Take 1 tablet by mouth daily.     omeprazole (PRILOSEC) 20 MG capsule Take 20 mg by mouth daily.     polyethylene glycol (MIRALAX / GLYCOLAX) 17 g packet Take 17 g by mouth daily as needed.     No facility-administered medications prior to visit.    No Known Allergies      Objective:     Physical Exam Vitals and nursing note reviewed.  Constitutional:      General: She is not in acute distress.    Appearance: Normal appearance.  HENT:     Head: Normocephalic.  Pulmonary:     Effort: No respiratory distress.  Musculoskeletal:     Cervical back: Normal range of motion.  Skin:    General: Skin is dry.     Coloration: Skin is not pale.  Neurological:     Mental Status: She is alert and oriented to person, place, and time.  Psychiatric:        Mood and Affect: Mood normal.   Ht 5\' 3"  (1.6 m)    Wt 151 lb 14.4 oz (68.9 kg)    BMI 26.91 kg/m   Wt Readings from Last 3 Encounters:  05/04/21 151 lb 14.4 oz (68.9 kg)  07/19/19 152 lb (68.9 kg)  06/16/19 157 lb 9.6 oz (71.5 kg)       Assessment & Plan:   Problem List Items Addressed This Visit   None   No orders of the defined types were placed in this encounter.   I discussed the assessment and treatment plan with the patient. The patient was provided an opportunity to ask questions and all were answered. The patient agreed with the plan and demonstrated an understanding of the instructions.   The patient was advised to call back or seek an in-person evaluation if the symptoms worsen or if the condition fails to improve as anticipated.  I provided *** minutes of face-to-face time during this encounter.   06/18/19, NP [facility] [phone] (phone) [phone] (fax)  [facility]

## 2021-05-07 NOTE — Telephone Encounter (Signed)
Pt was notified by vm 3 times to re login for appointment scheduled at 3:30. I also contacted pt's husband to help get in contact with the pt. I was not able to reach her by phone or by Careg[facility]. This was attempted up until 4:30 pm. Pt needs to reschedule with PCP for further symptoms.

## 2021-05-24 ENCOUNTER — Ambulatory Visit (INDEPENDENT_AMBULATORY_CARE_PROVIDER_SITE_OTHER): Payer: 59 | Admitting: Family Medicine

## 2021-05-24 ENCOUNTER — Encounter: Payer: Self-pay | Admitting: Family Medicine

## 2021-05-24 VITALS — BP 100/70 | HR 76 | Temp 97.7°F | Resp 16 | Ht 63.5 in | Wt 172.8 lb

## 2021-05-24 DIAGNOSIS — E559 Vitamin D deficiency, unspecified: Secondary | ICD-10-CM

## 2021-05-24 DIAGNOSIS — Z0000 Encounter for general adult medical examination without abnormal findings: Secondary | ICD-10-CM | POA: Diagnosis not present

## 2021-05-24 DIAGNOSIS — E669 Obesity, unspecified: Secondary | ICD-10-CM | POA: Diagnosis not present

## 2021-05-24 DIAGNOSIS — K921 Melena: Secondary | ICD-10-CM | POA: Diagnosis not present

## 2021-05-24 DIAGNOSIS — N926 Irregular menstruation, unspecified: Secondary | ICD-10-CM

## 2021-05-24 DIAGNOSIS — E663 Overweight: Secondary | ICD-10-CM | POA: Insufficient documentation

## 2021-05-24 LAB — CBC WITH DIFFERENTIAL/PLATELET
Basophils Absolute: 0 10*3/uL (ref 0.0–0.1)
Basophils Relative: 0.6 % (ref 0.0–3.0)
Eosinophils Absolute: 0.2 10*3/uL (ref 0.0–0.7)
Eosinophils Relative: 3.7 % (ref 0.0–5.0)
HCT: 39.4 % (ref 36.0–46.0)
Hemoglobin: 12.8 g/dL (ref 12.0–15.0)
Lymphocytes Relative: 21.5 % (ref 12.0–46.0)
Lymphs Abs: 1 10*3/uL (ref 0.7–4.0)
MCHC: 32.5 g/dL (ref 30.0–36.0)
MCV: 87.6 fl (ref 78.0–100.0)
Monocytes Absolute: 0.3 10*3/uL (ref 0.1–1.0)
Monocytes Relative: 7 % (ref 3.0–12.0)
Neutro Abs: 3.1 10*3/uL (ref 1.4–7.7)
Neutrophils Relative %: 67.2 % (ref 43.0–77.0)
Platelets: 140 10*3/uL — ABNORMAL LOW (ref 150.0–400.0)
RBC: 4.5 Mil/uL (ref 3.87–5.11)
RDW: 16 % — ABNORMAL HIGH (ref 11.5–15.5)
WBC: 4.7 10*3/uL (ref 4.0–10.5)

## 2021-05-24 LAB — BASIC METABOLIC PANEL
BUN: 10 mg/dL (ref 6–23)
CO2: 27 mEq/L (ref 19–32)
Calcium: 9.4 mg/dL (ref 8.4–10.5)
Chloride: 106 mEq/L (ref 96–112)
Creatinine, Ser: 0.77 mg/dL (ref 0.40–1.20)
GFR: 96.53 mL/min (ref >60.00)
Glucose, Bld: 91 mg/dL (ref 70–99)
Potassium: 4 mEq/L (ref 3.5–5.1)
Sodium: 140 mEq/L (ref 135–145)

## 2021-05-24 LAB — VITAMIN D 25 HYDROXY (VIT D DEFICIENCY, FRACTURES): VITD: 29.61 ng/mL — ABNORMAL LOW (ref 30.00–100.00)

## 2021-05-24 LAB — HEPATIC FUNCTION PANEL
ALT: 16 U/L (ref 0–35)
AST: 18 U/L (ref 0–37)
Albumin: 4 g/dL (ref 3.5–5.2)
Alkaline Phosphatase: 48 U/L (ref 39–117)
Bilirubin, Direct: 0.2 mg/dL (ref 0.0–0.3)
Total Bilirubin: 1.5 mg/dL — ABNORMAL HIGH (ref 0.2–1.2)
Total Protein: 7.1 g/dL (ref 6.0–8.3)

## 2021-05-24 LAB — LIPID PANEL
Cholesterol: 166 mg/dL (ref 0–200)
HDL: 37.1 mg/dL — ABNORMAL LOW (ref >39.00)
NonHDL: 128.92
Total CHOL/HDL Ratio: 4
Triglycerides: 220 mg/dL — ABNORMAL HIGH (ref 0.0–149.0)
VLDL: 44 mg/dL — ABNORMAL HIGH (ref 0.0–40.0)

## 2021-05-24 LAB — TSH: TSH: 4.09 u[IU]/mL (ref 0.35–5.50)

## 2021-05-24 LAB — LDL CHOLESTEROL, DIRECT: Direct LDL: 123 mg/dL

## 2021-05-24 NOTE — Patient Instructions (Addendum)
Follow up in 1 year or as needed We'll notify you of your lab results and make any changes if needed We'll call you with your GI referral Call and schedule pap and mammo w/ Dr Billy Coast and have them send me a copy of each Continue to work on healthy diet and regular exercise- you can do it! Call with any questions or concerns Stay Safe!  Stay Healthy! Have a great weekend!!

## 2021-05-24 NOTE — Assessment & Plan Note (Signed)
Pt has gained 15 lbs since last visit.  Encouraged healthy diet and regular exercise.  Check labs to risk stratify.  Will follow.

## 2021-05-24 NOTE — Assessment & Plan Note (Signed)
Pt's PE WNL w/ exception of BMI.  UTD on Tdap.  Due for pap and mammo- pt to call and schedule.  Check labs.  Anticipatory guidance provided.

## 2021-05-24 NOTE — Progress Notes (Signed)
° °  Subjective:    Patient ID: Rachael Chandler, female    DOB: 1980/04/29, 41 y.o.   MRN: [id number]  HPI CPE- UTD on Tdap.  Due for pap and mammo w/ Dr Ronita Hipps  Patient Care Team    Relationship Specialty Notifications Start End  Midge Minium, MD PCP - General Family Medicine  05/23/11   Brien Few, MD Consulting Physician Obstetrics and Gynecology  05/12/14   Heath Lark, MD Consulting Physician Hematology and Oncology  05/12/14     Health Maintenance  Topic Date Due   Hepatitis C Screening  Never done   PAP SMEAR-Modifier  08/17/2017   COVID-19 Vaccine (3 - Booster for Pfizer series) 10/04/2019   INFLUENZA VACCINE  07/20/2021 (Originally 11/20/2020)   TETANUS/TDAP  07/06/2024   HIV Screening  Completed   HPV VACCINES  Aged Out      Review of Systems Patient reports no vision/ hearing changes, adenopathy,fever, persistant/recurrent hoarseness , swallowing issues, chest pain, palpitations, edema, persistant/recurrent cough, hemoptysis, dyspnea (rest/exertional/paroxysmal nocturnal), gastrointestinal bleeding (melena, rectal bleeding), abdominal pain, significant heartburn, GU symptoms (dysuria, hematuria, incontinence),  syncope, focal weakness, memory loss, numbness & tingling, skin/hair/nail changes, abnormal bruising or bleeding, anxiety, or depression.   + 15 lb weight gain + pt reports ongoing irregular bowel habits but recently did OTC stool testing and 2 of 2 were + for blood + irregular periods  This visit occurred during the SARS-CoV-2 public health emergency.  Safety protocols were in place, including screening questions prior to the visit, additional usage of staff PPE, and extensive cleaning of exam room while observing appropriate contact time as indicated for disinfecting solutions.      Objective:   Physical Exam General Appearance:    Alert, cooperative, no distress, appears stated age  Head:    Normocephalic, without obvious abnormality, atraumatic   Eyes:    PERRL, conjunctiva/corneas clear, EOM's intact, fundi    benign, both eyes  Ears:    Normal TM's and external ear canals, both ears  Nose:   Deferred due to COVID  Throat:   Neck:   Supple, symmetrical, trachea midline, no adenopathy;    Thyroid: no enlargement/tenderness/nodules  Back:     Symmetric, no curvature, ROM normal, no CVA tenderness  Lungs:     Clear to auscultation bilaterally, respirations unlabored  Chest Wall:    No tenderness or deformity   Heart:    Regular rate and rhythm, S1 and S2 normal, no murmur, rub   or gallop  Breast Exam:    Deferred to GYN  Abdomen:     Soft, non-tender, bowel sounds active all four quadrants,    no masses, no organomegaly  Genitalia:    Deferred to GYN  Rectal:    Extremities:   Extremities normal, atraumatic, no cyanosis or edema  Pulses:   2+ and symmetric all extremities  Skin:   Skin color, texture, turgor normal, no rashes or lesions  Lymph nodes:   Cervical, supraclavicular, and axillary nodes normal  Neurologic:   CNII-XII intact, normal strength, sensation and reflexes    throughout          Assessment & Plan:

## 2021-05-24 NOTE — Assessment & Plan Note (Signed)
Check labs and replete prn. 

## 2021-05-25 ENCOUNTER — Telehealth: Payer: Self-pay

## 2021-05-25 DIAGNOSIS — E2839 Other primary ovarian failure: Secondary | ICD-10-CM

## 2021-05-25 NOTE — Telephone Encounter (Signed)
LVM for patient to return call about labs. Referral placed.

## 2021-05-25 NOTE — Telephone Encounter (Signed)
-----   Message from Sheliah Hatch, MD sent at 05/25/2021  9:17 AM EST ----- Labs are all stable.  Triglycerides (fatty part of blood) are elevated.  This will improve w/ healthy diet and regular exercise.  Vit D is mildly low.  Please start a daily OTC supplement of at least 2,000 units.  Your Estradiol is mildly low.  Based on this, we will refer you to Endocrinology (dx low estrogen) for a complete work up.

## 2021-05-30 LAB — ESTROGENS, TOTAL: Estrogen: 91.6 pg/mL

## 2021-05-30 LAB — FSH/LH
FSH: 6.9 m[IU]/mL
LH: 4.5 m[IU]/mL

## 2021-05-30 LAB — ESTRADIOL: Estradiol: 18 pg/mL

## 2021-06-08 NOTE — Progress Notes (Signed)
Patient did not appear on video for visit, unable to reach by phone, visit cancelled.

## 2021-06-21 ENCOUNTER — Ambulatory Visit (INDEPENDENT_AMBULATORY_CARE_PROVIDER_SITE_OTHER): Payer: 59 | Admitting: Family Medicine

## 2021-06-21 ENCOUNTER — Encounter: Payer: Self-pay | Admitting: Family Medicine

## 2021-06-21 VITALS — BP 110/70 | HR 78 | Temp 99.0°F | Resp 16 | Wt 172.6 lb

## 2021-06-21 DIAGNOSIS — M7989 Other specified soft tissue disorders: Secondary | ICD-10-CM | POA: Diagnosis not present

## 2021-06-21 NOTE — Progress Notes (Signed)
? ?  Subjective:  ? ? Patient ID: Rachael Chandler, female    DOB: 10/31/1980, 41 y.o.   MRN: [id number] ? ?HPI ?Lump- felt a lump on L side of neck.  First noticed yesterday.  TTP.  No redness.  No similar lumps in other places.  Denies recent illness.  No fevers/chills. ? ? ?Review of Systems ?For ROS see HPI  ? ?This visit occurred during the SARS-CoV-2 public health emergency.  Safety protocols were in place, including screening questions prior to the visit, additional usage of staff PPE, and extensive cleaning of exam room while observing appropriate contact time as indicated for disinfecting solutions.   ?   ?Objective:  ? Physical Exam ?Vitals reviewed.  ?Constitutional:   ?   General: She is not in acute distress. ?   Appearance: Normal appearance. She is not ill-appearing.  ?HENT:  ?   Head: Normocephalic and atraumatic.  ?   Nose: No congestion or rhinorrhea.  ?Eyes:  ?   Extraocular Movements: Extraocular movements intact.  ?   Conjunctiva/sclera: Conjunctivae normal.  ?   Pupils: Pupils are equal, round, and reactive to light.  ?Neck:  ? ?   Comments: 1.5 cm mobile but deep soft tissue mass, mildly TTP located at base of L neck just above clavicle ?Musculoskeletal:  ?   Cervical back: Normal range of motion and neck supple.  ?Lymphadenopathy:  ?   Head:  ?   Right side of head: No submental, submandibular, tonsillar, preauricular, posterior auricular or occipital adenopathy.  ?   Left side of head: No submental, submandibular, tonsillar, preauricular, posterior auricular or occipital adenopathy.  ?   Cervical:  ?   Right cervical: No superficial or posterior cervical adenopathy. ?   Left cervical: No superficial or posterior cervical adenopathy.  ?   Upper Body:  ?   Right upper body: No supraclavicular adenopathy.  ?   Left upper body: No supraclavicular adenopathy.  ?Skin: ?   General: Skin is warm and dry.  ?Neurological:  ?   General: No focal deficit present.  ?   Mental Status: She is alert and  oriented to person, place, and time.  ?Psychiatric:     ?   Mood and Affect: Mood normal.     ?   Behavior: Behavior normal.     ?   Thought Content: Thought content normal.  ? ? ? ? ? ?   ?Assessment & Plan:  ?Soft tissue mass neck- new.  Unclear if this is a deep cervical reactive lymph node, a cyst, or other.  Will get [place] to assess.  Pt expressed understanding and is in agreement w/ plan.  ? ?

## 2021-06-21 NOTE — Patient Instructions (Addendum)
Follow up as needed or as scheduled ?We'll call you to schedule your Ultrasound ?Call with any questions or concerns ?Stay Safe!  Stay Healthy!! ?

## 2021-06-28 ENCOUNTER — Telehealth: Payer: Self-pay

## 2021-06-28 NOTE — Telephone Encounter (Signed)
Caller name:Markeeta Ruger  ? ?On DPR? :Yes ? ?Call back number:[phone] ? ?Provider they see: Beverely Low  ? ?Reason for call:Pt is calling for Ultra Sound and she was referred to insurance will not cover most of it and pt has found a different place she would like ultra sound sent to [facility] booked through Radiology [email or web address] pt is wanting a call back  ? ?

## 2021-06-29 ENCOUNTER — Ambulatory Visit (HOSPITAL_BASED_OUTPATIENT_CLINIC_OR_DEPARTMENT_OTHER): Payer: 59

## 2021-06-29 NOTE — Telephone Encounter (Signed)
Pt is going to call back with a fax # [phone]  ?

## 2021-07-04 ENCOUNTER — Telehealth: Payer: Self-pay | Admitting: Family Medicine

## 2021-07-04 DIAGNOSIS — M7989 Other specified soft tissue disorders: Secondary | ICD-10-CM

## 2021-07-04 NOTE — Telephone Encounter (Signed)
Pt calling in asking for the results from the [place] she had done at [facility] on 07/03/21  ? ?Please advise  ?

## 2021-07-04 NOTE — Telephone Encounter (Signed)
The report states that there are '2 small solid lesions' at the area of concern but they cannot determine what they are and labeled them 'nonspecific'.  They are recommending either biopsy or CT scan.  At this time, I will place an ENT referral (entered) to determine the best way to proceed. ?

## 2021-07-04 NOTE — Telephone Encounter (Signed)
Communicated via mychart

## 2021-07-10 LAB — HM COLONOSCOPY

## 2021-07-13 DIAGNOSIS — R590 Localized enlarged lymph nodes: Secondary | ICD-10-CM | POA: Insufficient documentation

## 2021-08-21 ENCOUNTER — Encounter: Payer: Self-pay | Admitting: Family Medicine

## 2021-08-24 ENCOUNTER — Encounter: Payer: Self-pay | Admitting: Family Medicine

## 2021-11-29 DIAGNOSIS — N841 Polyp of cervix uteri: Secondary | ICD-10-CM | POA: Insufficient documentation

## 2021-11-29 DIAGNOSIS — Z8759 Personal history of other complications of pregnancy, childbirth and the puerperium: Secondary | ICD-10-CM | POA: Insufficient documentation

## 2021-12-06 LAB — HM MAMMOGRAPHY

## 2021-12-18 LAB — HM PAP SMEAR

## 2022-05-27 ENCOUNTER — Encounter: Payer: 59 | Admitting: Family Medicine

## 2022-07-12 ENCOUNTER — Ambulatory Visit (INDEPENDENT_AMBULATORY_CARE_PROVIDER_SITE_OTHER): Payer: Commercial Managed Care - PPO | Admitting: Family Medicine

## 2022-07-12 ENCOUNTER — Telehealth: Payer: Self-pay

## 2022-07-12 ENCOUNTER — Encounter: Payer: Self-pay | Admitting: Family Medicine

## 2022-07-12 VITALS — BP 124/60 | HR 78 | Temp 97.9°F | Ht 63.0 in | Wt 154.8 lb

## 2022-07-12 DIAGNOSIS — E663 Overweight: Secondary | ICD-10-CM | POA: Diagnosis not present

## 2022-07-12 DIAGNOSIS — E559 Vitamin D deficiency, unspecified: Secondary | ICD-10-CM

## 2022-07-12 DIAGNOSIS — Z0000 Encounter for general adult medical examination without abnormal findings: Secondary | ICD-10-CM | POA: Diagnosis not present

## 2022-07-12 LAB — CBC WITH DIFFERENTIAL/PLATELET
Basophils Absolute: 0 10*3/uL (ref 0.0–0.1)
Basophils Relative: 0.4 % (ref 0.0–3.0)
Eosinophils Absolute: 0.1 10*3/uL (ref 0.0–0.7)
Eosinophils Relative: 1.3 % (ref 0.0–5.0)
HCT: 38.5 % (ref 36.0–46.0)
Hemoglobin: 12.6 g/dL (ref 12.0–15.0)
Lymphocytes Relative: 18.9 % (ref 12.0–46.0)
Lymphs Abs: 1 10*3/uL (ref 0.7–4.0)
MCHC: 32.7 g/dL (ref 30.0–36.0)
MCV: 87.5 fl (ref 78.0–100.0)
Monocytes Absolute: 0.4 10*3/uL (ref 0.1–1.0)
Monocytes Relative: 7.1 % (ref 3.0–12.0)
Neutro Abs: 4 10*3/uL (ref 1.4–7.7)
Neutrophils Relative %: 72.3 % (ref 43.0–77.0)
Platelets: 170 10*3/uL (ref 150.0–400.0)
RBC: 4.4 Mil/uL (ref 3.87–5.11)
RDW: 14.3 % (ref 11.5–15.5)
WBC: 5.5 10*3/uL (ref 4.0–10.5)

## 2022-07-12 LAB — BASIC METABOLIC PANEL
BUN: 13 mg/dL (ref 6–23)
CO2: 28 mEq/L (ref 19–32)
Calcium: 9.8 mg/dL (ref 8.4–10.5)
Chloride: 107 mEq/L (ref 96–112)
Creatinine, Ser: 0.91 mg/dL (ref 0.40–1.20)
GFR: 78.37 mL/min (ref >60.00)
Glucose, Bld: 87 mg/dL (ref 70–99)
Potassium: 4.6 mEq/L (ref 3.5–5.1)
Sodium: 142 mEq/L (ref 135–145)

## 2022-07-12 LAB — LIPID PANEL
Cholesterol: 182 mg/dL (ref 0–200)
HDL: 49.5 mg/dL (ref >39.00)
LDL Cholesterol: 105 mg/dL — ABNORMAL HIGH (ref 0–99)
NonHDL: 132.16
Total CHOL/HDL Ratio: 4
Triglycerides: 138 mg/dL (ref 0.0–149.0)
VLDL: 27.6 mg/dL (ref 0.0–40.0)

## 2022-07-12 LAB — TSH: TSH: 1.86 u[IU]/mL (ref 0.35–5.50)

## 2022-07-12 LAB — HEPATIC FUNCTION PANEL
ALT: 10 U/L (ref 0–35)
AST: 16 U/L (ref 0–37)
Albumin: 4.4 g/dL (ref 3.5–5.2)
Alkaline Phosphatase: 31 U/L — ABNORMAL LOW (ref 39–117)
Bilirubin, Direct: 0.1 mg/dL (ref 0.0–0.3)
Total Bilirubin: 0.9 mg/dL (ref 0.2–1.2)
Total Protein: 7.3 g/dL (ref 6.0–8.3)

## 2022-07-12 LAB — VITAMIN D 25 HYDROXY (VIT D DEFICIENCY, FRACTURES): VITD: 35.88 ng/mL (ref 30.00–100.00)

## 2022-07-12 NOTE — Assessment & Plan Note (Signed)
Pt's PE WNL.  UTD on pap, mammo- will get records.  Check labs.  Anticipatory guidance provided.

## 2022-07-12 NOTE — Progress Notes (Signed)
   Subjective:    Patient ID: Rachael Chandler, female    DOB: 1981-01-20, 42 y.o.   MRN: [id number]  HPI CPE- UTD on Tdap.  UTD on pap and mammo at [facility]- will get records  Patient Care Team    Relationship Specialty Notifications Start End  Midge Minium, MD PCP - General Family Medicine  05/23/11   Brien Few, MD Consulting Physician Obstetrics and Gynecology  05/12/14   Heath Lark, MD Consulting Physician Hematology and Oncology  05/12/14      Health Maintenance  Topic Date Due   Hepatitis C Screening  Never done   PAP SMEAR-Modifier  08/17/2017   COVID-19 Vaccine (3 - 2023-24 season) 12/21/2021   INFLUENZA VACCINE  07/21/2022 (Originally 11/20/2021)   DTaP/Tdap/Td (3 - Td or Tdap) 07/06/2024   HIV Screening  Completed   HPV VACCINES  Aged Out      Review of Systems Patient reports no vision/ hearing changes, adenopathy,fever, persistant/recurrent hoarseness , swallowing issues, chest pain, palpitations, edema, persistant/recurrent cough, hemoptysis, dyspnea (rest/exertional/paroxysmal nocturnal), gastrointestinal bleeding (melena, rectal bleeding), abdominal pain, significant heartburn, bowel changes, GU symptoms (dysuria, hematuria, incontinence), Gyn symptoms (abnormal  bleeding, pain),  syncope, focal weakness, memory loss, numbness & tingling, skin/hair/nail changes, abnormal bruising or bleeding, anxiety, or depression.   + 20 lb weight loss    Objective:   Physical Exam General Appearance:    Alert, cooperative, no distress, appears stated age  Head:    Normocephalic, without obvious abnormality, atraumatic  Eyes:    PERRL, conjunctiva/corneas clear, EOM's intact, both eyes  Ears:    Normal TM's and external ear canals, both ears  Nose:   Nares normal, septum midline, mucosa normal, no drainage    or sinus tenderness  Throat:   Lips, mucosa, and tongue normal; teeth and gums normal  Neck:   Supple, symmetrical, trachea midline, no adenopathy;     Thyroid: no enlargement/tenderness/nodules  Back:     Symmetric, no curvature, ROM normal, no CVA tenderness  Lungs:     Clear to auscultation bilaterally, respirations unlabored  Chest Wall:    No tenderness or deformity   Heart:    Regular rate and rhythm, S1 and S2 normal, no murmur, rub   or gallop  Breast Exam:    Deferred to GYN  Abdomen:     Soft, non-tender, bowel sounds active all four quadrants,    no masses, no organomegaly  Genitalia:    Deferred to GYN  Rectal:    Extremities:   Extremities normal, atraumatic, no cyanosis or edema  Pulses:   2+ and symmetric all extremities  Skin:   Skin color, texture, turgor normal, no rashes or lesions  Lymph nodes:   Cervical, supraclavicular, and axillary nodes normal  Neurologic:   CNII-XII intact, normal strength, sensation and reflexes    throughout          Assessment & Plan:

## 2022-07-12 NOTE — Patient Instructions (Addendum)
Follow up in 1 year or as needed We'll notify you of your lab results and make any changes if needed Keep up the good work on healthy diet and regular exercise- you look great! Call with any questions or concerns Stay Safe!  Stay Healthy! Emmit Alexanders!!!

## 2022-07-12 NOTE — Telephone Encounter (Signed)
Left lab results on pt VM 

## 2022-07-12 NOTE — Assessment & Plan Note (Signed)
Pt is down 20 lbs since last visit.  Unfortunately this is mostly stress related as she is separating from her husband.  Encouraged healthy diet and regular exercise (also for stress relief).  Will follow.

## 2022-07-12 NOTE — Assessment & Plan Note (Signed)
Check labs and replete prn. 

## 2022-07-12 NOTE — Telephone Encounter (Signed)
-----   Message from Midge Minium, MD sent at 07/12/2022  3:37 PM EDT ----- Labs look great!  No changes at this time

## 2024-01-07 ENCOUNTER — Ambulatory Visit (INDEPENDENT_AMBULATORY_CARE_PROVIDER_SITE_OTHER): Admitting: Family Medicine

## 2024-01-07 ENCOUNTER — Encounter: Payer: Self-pay | Admitting: Family Medicine

## 2024-01-07 VITALS — BP 98/68 | HR 62 | Temp 98.3°F | Ht 64.0 in | Wt 131.0 lb

## 2024-01-07 DIAGNOSIS — R222 Localized swelling, mass and lump, trunk: Secondary | ICD-10-CM

## 2024-01-07 NOTE — Patient Instructions (Signed)
 Follow up as needed or as scheduled We'll call you to schedule your imaging Call with any questions or concerns Stay Safe!  Stay Healthy! HAPPY EARLY BIRTHDAY!!!

## 2024-01-07 NOTE — Progress Notes (Signed)
   Subjective:    Patient ID: Edsel Earnie Clos, female    DOB: 1980-05-19, 43 y.o.   MRN: [id number]  HPI Mass- pt reports a hard lump on R chest just below clavicle.  First noticed ~10 days ago.  Not painful but can be tender when pressed on.  No notable change in size.  UTD on mammo.   Review of Systems For ROS see HPI     Objective:   Physical Exam Vitals reviewed.  Constitutional:      General: She is not in acute distress.    Appearance: Normal appearance. She is not ill-appearing.  HENT:     Head: Normocephalic and atraumatic.  Chest:     Chest wall: Mass and tenderness (mild TTP over mass on chest wall) present. No deformity or swelling.    Skin:    General: Skin is warm and dry.     Findings: No erythema.  Neurological:     General: No focal deficit present.     Mental Status: She is alert and oriented to person, place, and time.  Psychiatric:        Mood and Affect: Mood normal.        Behavior: Behavior normal.        Thought Content: Thought content normal.           Assessment & Plan:  Mass of R chest wall- new.  Mildly TTP.  Noticed ~10 days ago w/o change in size or shape.  Will get diagnostic mammo and US  of chest wall to better evaluate.  Pt expressed understanding and is in agreement w/ plan.
